# Patient Record
Sex: Female | Born: 1955
Health system: Southern US, Community
[De-identification: ages and names within clinical notes are randomized; demographics above are authoritative.]

## PROBLEM LIST (undated history)

## (undated) DIAGNOSIS — I10 Essential (primary) hypertension: Secondary | ICD-10-CM

## (undated) DIAGNOSIS — E119 Type 2 diabetes mellitus without complications: Secondary | ICD-10-CM

## (undated) DIAGNOSIS — E785 Hyperlipidemia, unspecified: Secondary | ICD-10-CM

## (undated) HISTORY — DX: Hyperlipidemia, unspecified: E78.5

## (undated) HISTORY — DX: Type 2 diabetes mellitus without complications: E11.9

## (undated) HISTORY — PX: ABDOMINAL HYSTERECTOMY: SHX81

## (undated) HISTORY — DX: Essential (primary) hypertension: I10

---

## 1997-11-07 ENCOUNTER — Other Ambulatory Visit: Admission: RE | Admit: 1997-11-07 | Discharge: 1997-11-07 | Payer: Self-pay | Admitting: Obstetrics & Gynecology

## 1999-04-23 ENCOUNTER — Other Ambulatory Visit: Admission: RE | Admit: 1999-04-23 | Discharge: 1999-04-23 | Payer: Self-pay | Admitting: Obstetrics & Gynecology

## 2000-07-22 ENCOUNTER — Other Ambulatory Visit: Admission: RE | Admit: 2000-07-22 | Discharge: 2000-07-22 | Payer: Self-pay | Admitting: Obstetrics & Gynecology

## 2002-01-18 ENCOUNTER — Other Ambulatory Visit: Admission: RE | Admit: 2002-01-18 | Discharge: 2002-01-18 | Payer: Self-pay | Admitting: Obstetrics & Gynecology

## 2002-01-25 ENCOUNTER — Ambulatory Visit (HOSPITAL_COMMUNITY): Admission: RE | Admit: 2002-01-25 | Discharge: 2002-01-25 | Payer: Self-pay | Admitting: Obstetrics & Gynecology

## 2002-01-25 ENCOUNTER — Encounter: Payer: Self-pay | Admitting: Obstetrics & Gynecology

## 2004-11-22 ENCOUNTER — Other Ambulatory Visit: Admission: RE | Admit: 2004-11-22 | Discharge: 2004-11-22 | Payer: Self-pay | Admitting: Obstetrics & Gynecology

## 2006-03-24 HISTORY — PX: COLON SURGERY: SHX602

## 2007-01-28 ENCOUNTER — Ambulatory Visit (HOSPITAL_COMMUNITY): Admission: RE | Admit: 2007-01-28 | Discharge: 2007-01-28 | Payer: Self-pay | Admitting: Obstetrics & Gynecology

## 2009-05-09 ENCOUNTER — Ambulatory Visit (HOSPITAL_COMMUNITY): Admission: RE | Admit: 2009-05-09 | Discharge: 2009-05-09 | Payer: Self-pay | Admitting: Obstetrics & Gynecology

## 2010-08-08 ENCOUNTER — Ambulatory Visit: Payer: Self-pay | Admitting: Physical Therapy

## 2010-08-14 ENCOUNTER — Other Ambulatory Visit (HOSPITAL_COMMUNITY): Payer: Self-pay | Admitting: Obstetrics & Gynecology

## 2010-08-14 ENCOUNTER — Ambulatory Visit (HOSPITAL_COMMUNITY)
Admission: RE | Admit: 2010-08-14 | Discharge: 2010-08-14 | Disposition: A | Discharge status: Home / Self Care | Payer: BC Managed Care – PPO | Source: Ambulatory Visit | Attending: Obstetrics & Gynecology | Admitting: Obstetrics & Gynecology

## 2010-08-14 DIAGNOSIS — N289 Disorder of kidney and ureter, unspecified: Secondary | ICD-10-CM | POA: Insufficient documentation

## 2010-08-14 DIAGNOSIS — S3710XA Unspecified injury of ureter, initial encounter: Secondary | ICD-10-CM

## 2010-08-14 MED ORDER — IOHEXOL 300 MG/ML  SOLN
100.0000 mL | Freq: Once | INTRAMUSCULAR | Status: AC | PRN
  Administered 2010-08-14: 100 mL via INTRAVENOUS

## 2013-03-24 HISTORY — PX: GALLBLADDER SURGERY: SHX652

## 2014-08-15 ENCOUNTER — Encounter: Payer: BLUE CROSS/BLUE SHIELD | Attending: Family Medicine

## 2014-08-15 VITALS — Ht 63.75 in | Wt 213.3 lb

## 2014-08-15 DIAGNOSIS — Z713 Dietary counseling and surveillance: Secondary | ICD-10-CM | POA: Insufficient documentation

## 2014-08-15 DIAGNOSIS — E119 Type 2 diabetes mellitus without complications: Secondary | ICD-10-CM | POA: Diagnosis present

## 2014-08-15 NOTE — Progress Notes (Signed)

## 2014-08-22 DIAGNOSIS — E119 Type 2 diabetes mellitus without complications: Secondary | ICD-10-CM | POA: Diagnosis not present

## 2014-08-22 NOTE — Progress Notes (Signed)

## 2014-08-29 ENCOUNTER — Encounter: Payer: BLUE CROSS/BLUE SHIELD | Attending: Family Medicine

## 2014-08-29 DIAGNOSIS — Z713 Dietary counseling and surveillance: Secondary | ICD-10-CM | POA: Insufficient documentation

## 2014-08-29 DIAGNOSIS — E119 Type 2 diabetes mellitus without complications: Secondary | ICD-10-CM | POA: Insufficient documentation

## 2014-08-29 NOTE — Progress Notes (Signed)
Patient was seen on 08/29/2014 for the third of a series of three diabetes self-management courses at the [facility].   Anna Barber. State the amount of activity recommended for healthy living . Describe activities suitable for individual needs . Identify ways to regularly incorporate activity into daily life . Identify barriers to activity and ways to over come these barriers  Identify diabetes medications being personally used and their primary action for lowering glucose and possible side effects . Describe role of stress on blood glucose and develop strategies to address psychosocial issues . Identify diabetes complications and ways to prevent them  Explain how to manage diabetes during illness . Evaluate success in meeting personal goal . Establish 2-3 goals that they will plan to diligently work on until they return for the  5762-month follow-up visit  Goals:   I will count my carb choices at most meals and snacks  I will be active 15 minutes or more 3 times a week  I will take my diabetes medications as scheduled  I will test my glucose at least 1 times a day, 7 days a week  Your patient has identified these potential barriers to change:  None stated  Your patient has identified their diabetes self-care support plan as  None stated Plan:  Attend Core 4 in 4 months

## 2015-10-25 ENCOUNTER — Encounter: Payer: Self-pay | Admitting: *Deleted

## 2015-10-25 ENCOUNTER — Encounter: Payer: BLUE CROSS/BLUE SHIELD | Attending: Family Medicine | Admitting: *Deleted

## 2015-10-25 DIAGNOSIS — Z713 Dietary counseling and surveillance: Secondary | ICD-10-CM | POA: Insufficient documentation

## 2015-10-25 DIAGNOSIS — E119 Type 2 diabetes mellitus without complications: Secondary | ICD-10-CM | POA: Insufficient documentation

## 2015-10-25 NOTE — Patient Instructions (Signed)
Eat breakfast each day.  It doesn't matter what  Eat meals a the table without the tv or book (ok to have audio book or music) Aim to eat more slowly Stop when satisfied/comfortable  Consider the health benefit of exercise, not focus on the weight loss because that doesn't help Live the life you want to lead

## 2015-10-25 NOTE — Progress Notes (Signed)
Medical Nutrition Therapy:  Appt start time: 0900 end time:  1000.   Assessment:  Primary concerns today: Anna Barber is here for nutrition counseling pertaining to follow up for diabetes education.  She attended DSME classes previously and requested a follow up with this provider.  She would like to lose weight.  She states she has been engaging in stress eating.  She wants some more specific guidelines.  She liked having a strict meal plan when she had GDM and wants a new meal plan She eats out for lunch.  She skips breakfast frequently.  Sometimes she cooks dinner and sometimes she gets fast food.  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  She states she is picky eater.  She gets the home chef 3 nights/week and it's 2 portions.  Sometimes she eats larger portions.  Thinks she makes decisions at the last minute.  Sometimes she throws out the sides from her home chef meals  Likes baby carrots, lettuce (iceberg or romaine), green beans, starchy vegetables, summer squash with onions.  Does not like cruciferous vegetables.  Likes orange, apples, grapes, bananas  States she eats large portions of most things.  She does not need variety and will eat the same thing over and over. Doesn't like condiments like salad dressing and mayo.   When at home, she eats in the bedroom or at the table in the sunroom. She typically watches tv while eating or reading or watching on her iPad or on her phone.  She is a Energy manager.  She doesn't drink much while eating, or drinks diet coke.  She chews ice (iron is WNL).  She does not drink alcohol much at all.  States she has a difficult 6 months after the election which "allowed me to give myself permission to eat the things I want to eat".  Does not have other sources of stress.  She has a good support system with family and friends.  Preferred Learning Style:   No preference indicated   Learning Readiness:   Change in  progress   MEDICATIONS: see list   DIETARY INTAKE:  24-hr recall:  B ( AM): peach  Snk ( AM): none  L ( PM): salad with iceberg lettuce, [place], cheddar, crumble of bacon.  Sometimes fritos Snk ( PM): 2 fudge popsicles. More often peanuts portioned out.  Also likes saltines with cheese or PB (likes to slather, not measure), also likes peaches in the summer D ( PM): 2 chicken fettuccini lean cusines and butter beans Snk ( PM): none Beverages: diet coke and melted ice  Usual physical activity: none.  Doesn't like to sweat    Nutritional Diagnosis:  NB-1.5 Disordered eating pattern As related to meal skipping and diet mentality, then emotional eating at night.  As evidenced by dietary recall.    Intervention:  Nutrition counseling provided.  Encouraged patient to reject traditional diet mentality of "good" vs "bad" foods.  There are no good and bad foods, but rather food is fuel that we needs for our bodies.  When we don't get enough fuel, our bodies suffer the metabolic consequences.  Encouraged patient to eat whatever foods will satisfy them, regardless of their nutritional value.  We will discuss nutritional values of foods at a subsequent appointment.  Encouraged patient to honor their body's internal hunger and fullness cues.  Throughout the day, check in mentally and rate hunger.  Try not to eat when ravenous, but instead when  slightly hungry.  Then choose food(s) that will be satisfying regardless of nutritional content.  Sit down to enjoy those foods.  Minimize distractions: turn off tv, put away books, work, Programmer, applications.  Make the meal last at least 20 minutes in order to give time to experience and register satiety.  Stop eating when full regardless of how much food is left on the plate.  Get more if still hungry.  The key is to honor fullness so throughout the meal, rate fullness factor and stop when comfortably full, but not stuffed.  Eventually the novelty will wear out and each food  will be equal in terms of its emotional appeal.  This will be a learning process and some days more food will be eaten, some days less.  The key is to honor hunger and fullness without any feelings of guilt.  Pay attention to what the internal cues are, rather than any external factors.  Goals: Eat breakfast each day.  It doesn't matter what  Eat meals a the table without the tv or book (ok to have audio book or music) Aim to eat more slowly Stop when satisfied/comfortable  Consider the health benefit of exercise, not focus on the weight loss because that doesn't help Live the life you want to lead  Teaching Method Utilized:  Auditory   Barriers to learning/adherence to lifestyle change: diet mentality  Demonstrated degree of understanding via:  Teach Back   Monitoring/Evaluation:  Dietary intake, exercise,  and body weight in 1 month(s).

## 2015-11-29 ENCOUNTER — Ambulatory Visit: Payer: BLUE CROSS/BLUE SHIELD | Admitting: *Deleted

## 2019-02-28 ENCOUNTER — Ambulatory Visit
Admission: RE | Admit: 2019-02-28 | Discharge: 2019-02-28 | Disposition: A | Discharge status: Home / Self Care | Payer: BLUE CROSS/BLUE SHIELD | Source: Ambulatory Visit | Attending: Family Medicine | Admitting: Family Medicine

## 2019-02-28 ENCOUNTER — Other Ambulatory Visit: Payer: Self-pay | Admitting: Family Medicine

## 2019-02-28 DIAGNOSIS — R0600 Dyspnea, unspecified: Secondary | ICD-10-CM

## 2019-03-04 ENCOUNTER — Ambulatory Visit (INDEPENDENT_AMBULATORY_CARE_PROVIDER_SITE_OTHER): Payer: BC Managed Care – PPO | Admitting: Cardiology

## 2019-03-04 ENCOUNTER — Other Ambulatory Visit: Payer: Self-pay

## 2019-03-04 ENCOUNTER — Encounter: Payer: Self-pay | Admitting: Cardiology

## 2019-03-04 VITALS — BP 114/60 | HR 107 | Ht 63.5 in | Wt 198.5 lb

## 2019-03-04 DIAGNOSIS — R0602 Shortness of breath: Secondary | ICD-10-CM

## 2019-03-04 DIAGNOSIS — R9431 Abnormal electrocardiogram [ECG] [EKG]: Secondary | ICD-10-CM | POA: Diagnosis not present

## 2019-03-04 DIAGNOSIS — E119 Type 2 diabetes mellitus without complications: Secondary | ICD-10-CM | POA: Diagnosis not present

## 2019-03-04 DIAGNOSIS — Z8249 Family history of ischemic heart disease and other diseases of the circulatory system: Secondary | ICD-10-CM | POA: Diagnosis not present

## 2019-03-04 DIAGNOSIS — E782 Mixed hyperlipidemia: Secondary | ICD-10-CM

## 2019-03-04 MED ORDER — NITROGLYCERIN 0.4 MG SL SUBL: 0.4000 mg | SUBLINGUAL_TABLET | SUBLINGUAL | 2 refills | Status: AC | PRN

## 2019-03-04 MED ORDER — ROSUVASTATIN CALCIUM 20 MG PO TABS: 20.0000 mg | ORAL_TABLET | Freq: Every day | ORAL | 2 refills | Status: DC

## 2019-03-04 MED ORDER — CLOPIDOGREL BISULFATE 75 MG PO TABS: 75.0000 mg | ORAL_TABLET | Freq: Every day | ORAL | 1 refills | Status: DC

## 2019-03-04 MED ORDER — PANTOPRAZOLE SODIUM 40 MG PO TBEC: 40.0000 mg | DELAYED_RELEASE_TABLET | Freq: Every day | ORAL | 1 refills | Status: DC

## 2019-03-04 MED ORDER — LOSARTAN POTASSIUM 25 MG PO TABS: 12.5000 mg | ORAL_TABLET | Freq: Every day | ORAL | 2 refills | Status: DC

## 2019-03-04 NOTE — Progress Notes (Signed)
Primary Physician:  Kelton Pillar, MD   Patient ID: Anna Barber, female    DOB: [birth year], 63 y.o.   MRN: [id number]  Subjective:    Chief Complaint  Patient presents with  . New Patient (Initial Visit)  . Shortness of Breath   HPI: Anna Barber  is a 63 y.o. female  with hyperlipidemia, type 2 diabetes, referred to [place] for worsening dyspnea and EKG changes by Dr. Laurann Montana.   She reports 1-2 days after halloween, she had an episode of what she describes as "GI attack". Had central chest pressure and discomfort between her shoulder blades. Chest pain resolved for the most part 10 days later. She states her symptoms were similar to when she had gallbladder attack. She had since developed shortness of breath at rest. She mostly noticed at night and would wake up having to take a good cleansing deep breath. Could breathe better sitting up. No leg swelling. She has not gained weight, actually has lost 20 lbs in the last 6 weeks due to decreased appetite and diarrhea. She was evaluated by her PCP and started on Nexium. For the last 3-4 days her symptoms have improved.    Denies any history of hypertension. States that lipids have been well controlled with Simvastatin. Diabetes is well controlled with Metformin.  She is a former smoker that quit approximately 32 years ago. Has family history of early CAD. Her father passed from MI at age 61.    Past Medical History:  Diagnosis Date  . Diabetes mellitus without complication ([place])   . Hyperlipidemia     Past Surgical History:  Procedure Laterality Date  . ABDOMINAL HYSTERECTOMY    . CESAREAN SECTION    . COLON SURGERY  2008   tubular adenoma  . GALLBLADDER SURGERY  2015    Social History   Socioeconomic History  . Marital status: Divorced    Spouse name: Not on file  . Number of children: 2  . Years of education: Not on file  . Highest education level: Not on file  Occupational History  . Not on file  Tobacco Use  .  Smoking status: Former Smoker    Packs/day: 1.50    Years: 15.00    Pack years: 22.50    Types: Cigarettes    Quit date: 03/04/1987    Years since quitting: 32.0  . Smokeless tobacco: Never Used  Substance and Sexual Activity  . Alcohol use: Yes    Comment: rarely  . Drug use: Not on file  . Sexual activity: Not on file  Other Topics Concern  . Not on file  Social History Narrative   2 grandchildren   Social Determinants of Health   Financial Resource Strain:   . Difficulty of Paying Living Expenses: Not on file  Food Insecurity:   . Worried About Charity fundraiser in the Last Year: Not on file  . Ran Out of Food in the Last Year: Not on file  Transportation Needs:   . Lack of Transportation (Medical): Not on file  . Lack of Transportation (Non-Medical): Not on file  Physical Activity:   . Days of Exercise per Week: Not on file  . Minutes of Exercise per Session: Not on file  Stress:   . Feeling of Stress : Not on file  Social Connections:   . Frequency of Communication with Friends and Family: Not on file  . Frequency of Social Gatherings with Friends and Family: Not on file  .  Attends Religious Services: Not on file  . Active Member of Clubs or Organizations: Not on file  . Attends Archivist Meetings: Not on file  . Marital Status: Not on file  Intimate Partner Violence:   . Fear of Current or Ex-Partner: Not on file  . Emotionally Abused: Not on file  . Physically Abused: Not on file  . Sexually Abused: Not on file    Review of Systems  Constitution: Positive for decreased appetite and weight loss. Negative for malaise/fatigue and weight gain.  Eyes: Negative for visual disturbance.  Cardiovascular: Positive for dyspnea on exertion. Negative for chest pain, claudication, leg swelling, orthopnea, palpitations and syncope.  Respiratory: Negative for hemoptysis and wheezing.   Endocrine: Negative for cold intolerance and heat intolerance.    Hematologic/Lymphatic: Does not bruise/bleed easily.  Skin: Negative for nail changes.  Musculoskeletal: Negative for muscle weakness and myalgias.  Gastrointestinal: Positive for diarrhea. Negative for abdominal pain, change in bowel habit, nausea and vomiting.  Neurological: Negative for difficulty with concentration, dizziness, focal weakness and headaches.  Psychiatric/Behavioral: Negative for altered mental status and suicidal ideas.  All other systems reviewed and are negative.     Objective:  Blood pressure 114/60, pulse (!) 107, height 5' 3.5" (1.613 m), weight 198 lb 8 oz (90 kg), SpO2 98 %. Body mass index is 34.61 kg/m.    Physical Exam  Constitutional: She is oriented to person, place, and time. Vital signs are normal. She appears well-developed and well-nourished.  HENT:  Head: Normocephalic and atraumatic.  Cardiovascular: Normal rate, regular rhythm, normal heart sounds and intact distal pulses.  Pulmonary/Chest: Effort normal and breath sounds normal. No accessory muscle usage. No respiratory distress.  Abdominal: Soft. Bowel sounds are normal.  Musculoskeletal:        General: Normal range of motion.     Cervical back: Normal range of motion.  Neurological: She is alert and oriented to person, place, and time.  Skin: Skin is warm and dry.  Vitals reviewed.  Radiology: No results found.  Laboratory examination:   02/28/2019: Glucose 130, Creatinine 0.67, eGFR 89/108, Potassium 4.2, CMP otherwise normal. WBC 11.8, RBC 4.02, Hgb 10.5, Hct 32.9, MCV normal, MCH 26.1, RDW 15.7%, Plt 540. TSH normal  No flowsheet data found. No flowsheet data found. Lipid Panel  No results found for: CHOL, TRIG, HDL, CHOLHDL, VLDL, LDLCALC, LDLDIRECT HEMOGLOBIN A1C No results found for: HGBA1C, MPG TSH No results for input(s): TSH in the last 8760 hours.  PRN Meds:. Medications Discontinued During This Encounter  Medication Reason  . simvastatin (ZOCOR) 40 MG tablet  Discontinued by provider  . esomeprazole (NEXIUM) 40 MG capsule Discontinued by provider   Current Meds  Medication Sig  . aspirin EC 81 MG tablet Take 81 mg by mouth daily.  . betamethasone dipropionate 0.05 % cream APPLY TWICE DAILY FOR 2 WEEKS AS NEEDED TO HAND DERMATITIS  . desonide (DESOWEN) 0.05 % cream APPLY TWICE DAILY FOR SEBORRHEIC DERMATITIS ON FACE FOR 1 TO 2 WEEKS  . metFORMIN (GLUCOPHAGE) 500 MG tablet Take by mouth as directed. 2 tablets in the morning, 2 tablets in the evening  . [DISCONTINUED] esomeprazole (NEXIUM) 40 MG capsule Take 40 mg by mouth 2 (two) times daily.  . [DISCONTINUED] simvastatin (ZOCOR) 40 MG tablet Take 40 mg by mouth daily.    Cardiac Studies:   none  Assessment:   Abnormal EKG - Plan: PCV ECHOCARDIOGRAM COMPLETE, Lipid Profile, Lipoprotein A (LPA)  Shortness of breath - Plan:  EKG 12-Lead, PCV MYOCARDIAL PERFUSION WITH LEXISCAN, B Nat Peptide  Family history of early CAD - Plan: Lipoprotein A (LPA)  Type 2 diabetes mellitus without complication, without long-term current use of insulin (HCC)  Mixed hyperlipidemia  PCP EKG 02/28/2019: Sinus tachycardia at 108 bpm, normal axis, PRWP, cannot exclude anterior infarct old. ST-T wave abnormality in anterolateral leads, cannot exclude ischemia.   EKG 03/04/2019: Sinus tachycardia at 105 bpm, left atrial enlargement, normal axis, anterolateral infarct old. Diffuse ST-T wave abnormality.   Recommendations:   Patient's recent episode that she considers "GI attack", concerning for angina and ACS. She is high risk given her family history of early CAD, diabetes, and HLD. By reviewing her EKG compared to her previous EKG in 2016, EKG is markedly abnormal with evidence of potential anterolateral infarct.  Over the last few days she has been feeling some better, and suspect she may have completed her infarct.  She will need further evaluation with Lexiscan nuclear stress testing for evaluation of any  reversible ischemia. Will send nitroglycerin should she have any recurrent symptoms. She is currently on 81 mg ASA. I will go ahead and start Plavix 75 mg daily. Due to potential interaction will change from Nexium to Protonix.   I also suspect her shortness of breath is related to depressed LVEF. Will obtain echocardiogram for further evaluation.  Also obtain BNP.  Clinically no evidence of heart failure.    She is tachycardic on exam that is likely compensatory; therefore, have not started beta-blocker therapy.  I will start her on low-dose losartan. Depending upon echo results, will anticipate changing to [facility] if indicated.  She will need BMP in 2 weeks for surveillance of her kidney function.  Blood pressure is borderline soft and will need to be closely monitored.   She has not had recent lipid panel, will obtain this for further risk stratification. Will also obtain lipoprotein A. She reports that her lipids have been fairly well controlled with Simvastatin. In view of recent events, will change to Crestor 20 mg daily. I will see her back in 3 weeks for close monitoring.  "Total time spent with patient was 60 minutes and greater than 50% of that time was spent in face to face discussion, counseling and coordination care"    *I have discussed this case with Dr. Virgina Jock and he personally examined the patient and participated in formulating the plan.*    Miquel Dunn, MSN, APRN, FNP-C [facility]. [place] Office: [phone] Fax: [phone]

## 2019-03-08 LAB — LIPID PANEL
Chol/HDL Ratio: 3 ratio (ref 0.0–4.4)
Cholesterol, Total: 90 mg/dL — ABNORMAL LOW (ref 100–199)
HDL: 30 mg/dL — ABNORMAL LOW (ref >39)
LDL Chol Calc (NIH): 37 mg/dL (ref 0–99)
Triglycerides: 131 mg/dL (ref 0–149)
VLDL Cholesterol Cal: 23 mg/dL (ref 5–40)

## 2019-03-08 LAB — LIPOPROTEIN A (LPA): Lipoprotein (a): 52 nmol/L (ref <75.0)

## 2019-03-09 ENCOUNTER — Other Ambulatory Visit: Payer: BC Managed Care – PPO

## 2019-03-09 ENCOUNTER — Telehealth: Payer: Self-pay

## 2019-03-09 ENCOUNTER — Other Ambulatory Visit: Payer: Self-pay

## 2019-03-09 ENCOUNTER — Ambulatory Visit (INDEPENDENT_AMBULATORY_CARE_PROVIDER_SITE_OTHER): Payer: BC Managed Care – PPO

## 2019-03-09 DIAGNOSIS — R0602 Shortness of breath: Secondary | ICD-10-CM

## 2019-03-09 DIAGNOSIS — R9431 Abnormal electrocardiogram [ECG] [EKG]: Secondary | ICD-10-CM | POA: Diagnosis not present

## 2019-03-09 NOTE — Telephone Encounter (Signed)
-----   Message from Ashton Haynes Kelley, NP sent at 03/09/2019  8:45 AM EST ----- Lipids and lipoprotein A are well controlled. Continue with medications as prescribed 

## 2019-03-09 NOTE — Telephone Encounter (Signed)
-----   Message from Miquel Dunn, NP sent at 03/09/2019  8:45 AM EST ----- Lipids and lipoprotein A are well controlled. Continue with medications as prescribed

## 2019-03-10 LAB — BRAIN NATRIURETIC PEPTIDE

## 2019-03-10 NOTE — Progress Notes (Signed)
FYI

## 2019-03-10 NOTE — Progress Notes (Signed)
Please add to my schedule for in office visit on 12/22 PM to discus results.

## 2019-03-15 ENCOUNTER — Telehealth: Payer: BC Managed Care – PPO | Admitting: Cardiology

## 2019-03-15 ENCOUNTER — Other Ambulatory Visit: Payer: Self-pay

## 2019-03-15 DIAGNOSIS — I255 Ischemic cardiomyopathy: Secondary | ICD-10-CM

## 2019-03-15 DIAGNOSIS — I5021 Acute systolic (congestive) heart failure: Secondary | ICD-10-CM

## 2019-03-15 NOTE — Progress Notes (Signed)
   Follow up visit  Subjective:   Anna Barber, female    DOB: Dec 24, 1955, 63 y.o.   MRN: [id number]    HPI  63 year old Caucasian female with a 2 diabetes mellitus, obesity, strong family history of early coronary artery disease, anteroseptal MI while at home in first week November.   She is currently out of town and did not know of her appt today. She is not having any chest pain, shortness of breath at this time.   Lexiscan nuclear stress test and echocardiogram show completed LAD territory infarct, now with systolic heart failure EF 25-30%. Discussed the following findings with the patient.   Echocardiogram 03/09/2019: Left ventricle cavity is normal in size and wall thickness. Large LAD territory akinesis with thinning s/o completed infarct. Severely depressed LV systolic function with EF 25%. Doppler evidence of grade I (impaired) diastolic dysfunction, normal LAP.  Patient is mildly tachycardic throughout the study.  Trileaflet aortic valve.  Trace aortic regurgitation. Mild (Grade I) mitral regurgitation. Mild tricuspid regurgitation. Estimated pulmonary artery systolic pressure is 27 mmHg. IVC is dilated with respiratory variation. Estimated RA pressure 8 mmHg.  Lexiscan Sestamibi Stress Test 03/09/2019: Nondiagnostic ECG stress. Resting EKG/ECG demonstrated normal sinus rhythm. Observed anterolateral infarct. Peak EKG/ECG no change with Lexiscan infusion. Patient developed chest pain level 2 of 10, and exhibited chest discomfort.  Extensive fixed perfusion abnormality with no reversibility noted in the mid to distal anterior, anteroseptal, apical and mid to distal inferoseptal and inferoapical region suggestive of a completed infarct in the LAD territory. Gated images reveal akinesis in the same region.  LVEF severely dysfunctional at 26%.  High risk study, no prior study for comparison.  She is out of town. I will see her in office on 12/28 and start ion Entresto. I will  place a cardiac MRI order to look for any viable myocardium in the infarcted territory.   Time spent: 8 min

## 2019-03-21 ENCOUNTER — Ambulatory Visit: Payer: BC Managed Care – PPO | Admitting: Cardiology

## 2019-03-21 ENCOUNTER — Encounter: Payer: Self-pay | Admitting: Cardiology

## 2019-03-21 ENCOUNTER — Other Ambulatory Visit: Payer: Self-pay

## 2019-03-21 VITALS — BP 122/60 | HR 103 | Temp 97.7°F | Ht 63.5 in | Wt 201.9 lb

## 2019-03-21 DIAGNOSIS — I255 Ischemic cardiomyopathy: Secondary | ICD-10-CM | POA: Diagnosis not present

## 2019-03-21 DIAGNOSIS — E119 Type 2 diabetes mellitus without complications: Secondary | ICD-10-CM | POA: Insufficient documentation

## 2019-03-21 DIAGNOSIS — I5020 Unspecified systolic (congestive) heart failure: Secondary | ICD-10-CM

## 2019-03-21 DIAGNOSIS — I5022 Chronic systolic (congestive) heart failure: Secondary | ICD-10-CM

## 2019-03-21 DIAGNOSIS — E782 Mixed hyperlipidemia: Secondary | ICD-10-CM

## 2019-03-21 DIAGNOSIS — I2510 Atherosclerotic heart disease of native coronary artery without angina pectoris: Secondary | ICD-10-CM | POA: Insufficient documentation

## 2019-03-21 MED ORDER — ENTRESTO 24-26 MG PO TABS: 1.0000 | ORAL_TABLET | Freq: Two times a day (BID) | ORAL | 2 refills | Status: DC

## 2019-03-21 MED ORDER — IVABRADINE HCL 5 MG PO TABS: 5.0000 mg | ORAL_TABLET | Freq: Two times a day (BID) | ORAL | 2 refills | Status: DC

## 2019-03-21 NOTE — Progress Notes (Addendum)
Follow up visit  Subjective:   Anna Barber, female    DOB: 1955-11-21, 63 y.o.   MRN: [id number]   HPI  63 year old Caucasian female with a 2 diabetes mellitus, obesity, strong family history of early coronary artery disease, anteroseptal MI while at home in first week November.   Lexiscan nuclear stress test and echocardiogram show completed LAD territory infarct, now with systolic heart failure EF 25-30%. I recommend MRI to evaluate for any hibernating myocardium. This is pending.  Patient has not had any recurrent chest pain. Her appetite, which was poor after the stated event in November, is now improving, Her breathing has improved, but she still has to stop after lifting heavy boxes out of car, for example.    Current Outpatient Medications on File Prior to Visit  Medication Sig Dispense Refill  . aspirin EC 81 MG tablet Take 81 mg by mouth daily.    . betamethasone dipropionate 0.05 % cream APPLY TWICE DAILY FOR 2 WEEKS AS NEEDED TO HAND DERMATITIS    . clopidogrel (PLAVIX) 75 MG tablet Take 1 tablet (75 mg total) by mouth daily. 30 tablet 1  . desonide (DESOWEN) 0.05 % cream APPLY TWICE DAILY FOR SEBORRHEIC DERMATITIS ON FACE FOR 1 TO 2 WEEKS    . losartan (COZAAR) 25 MG tablet Take 0.5 tablets (12.5 mg total) by mouth daily. 15 tablet 2  . metFORMIN (GLUCOPHAGE) 500 MG tablet Take by mouth as directed. 2 tablets in the morning, 2 tablets in the evening    . nitroGLYCERIN (NITROSTAT) 0.4 MG SL tablet Place 1 tablet (0.4 mg total) under the tongue every 5 (five) minutes as needed for chest pain. 30 tablet 2  . pantoprazole (PROTONIX) 40 MG tablet Take 1 tablet (40 mg total) by mouth daily. 30 tablet 1  . rosuvastatin (CRESTOR) 20 MG tablet Take 1 tablet (20 mg total) by mouth daily. 30 tablet 2   No current facility-administered medications on file prior to visit.    Cardiovascular & other pertient studies:  EKG 03/21/2019: Sinus tachycardia 105 bpm. Anterior  infarct, age indeterminate. Low voltage.  Echocardiogram 03/09/2019: Left ventricle cavity is normal in size and wall thickness. Large LAD territory akinesis with thinning s/o completed infarct. Severely depressed LV systolic function with EF 25%. Doppler evidence of grade I (impaired) diastolic dysfunction, normal LAP. Patient is mildly tachycardic throughout the study.  Trileaflet aortic valve. Trace aortic regurgitation. Mild (Grade I) mitral regurgitation. Mild tricuspid regurgitation. Estimated pulmonary artery systolic pressure is 27 mmHg. IVC is dilated with respiratory variation. Estimated RA pressure 8 mmHg.  Lexiscan Sestamibi Stress Test12/16/2020: Nondiagnostic ECG stress. Resting EKG/ECG demonstrated normal sinus rhythm. Observed anterolateral infarct. Peak EKG/ECG no change with Lexiscan infusion. Patient developed chest pain level 2 of 10, and exhibited chest discomfort.  Extensive fixed perfusion abnormality with no reversibility noted in the mid to distal anterior, anteroseptal, apical and mid to distal inferoseptal and inferoapical region suggestive of a completed infarct in the LAD territory. Gated images reveal akinesis in the same region. LVEF severely dysfunctional at 26%. High risk study, no prior study for comparison.  Recent labs: 03/07/2019: Chol 90, TG 131, HDL 30, LDL 37 Lipoprotein (a) 52, normal  02/28/2019:  Glucose 130, Cr 0.67. EGFR 89. K 4.2.  H/H 10.5/32.9. MCV normal. Platelets 540   Review of Systems  Constitution: Negative for decreased appetite, malaise/fatigue, weight gain and weight loss.  HENT: Negative for congestion.   Eyes: Negative for visual disturbance.  Cardiovascular: Positive  for dyspnea on exertion. Negative for chest pain, leg swelling, palpitations and syncope.  Respiratory: Positive for shortness of breath. Negative for cough.   Endocrine: Negative for cold intolerance.  Hematologic/Lymphatic: Does not bruise/bleed easily.   Skin: Negative for itching and rash.  Musculoskeletal: Negative for myalgias.  Gastrointestinal: Negative for abdominal pain, nausea and vomiting.  Genitourinary: Negative for dysuria.  Neurological: Negative for dizziness and weakness.  Psychiatric/Behavioral: The patient is not nervous/anxious.   All other systems reviewed and are negative.        Vitals:   03/21/19 1550  BP: 122/60  Pulse: (!) 103  Temp: 97.7 F (36.5 C)  SpO2: 97%     Body mass index is 35.2 kg/m. Filed Weights   03/21/19 1550  Weight: 201 lb 14.4 oz (91.6 kg)     Objective:   Physical Exam  Constitutional: She is oriented to person, place, and time. She appears well-developed and well-nourished. No distress.  HENT:  Head: Normocephalic and atraumatic.  Eyes: Pupils are equal, round, and reactive to light. Conjunctivae are normal.  Neck: No JVD present.  Cardiovascular: Regular rhythm and intact distal pulses. Tachycardia present.  No murmur heard. Pulmonary/Chest: Effort normal and breath sounds normal. She has no wheezes. She has no rales.  Abdominal: Soft. Bowel sounds are normal. There is no rebound.  Musculoskeletal:        General: Edema (Trace b/l) present.  Lymphadenopathy:    She has no cervical adenopathy.  Neurological: She is alert and oriented to person, place, and time. No cranial nerve deficit.  Skin: Skin is warm and dry.  Psychiatric: She has a normal mood and affect.  Nursing note and vitals reviewed.         Assessment & Recommendations:   63 year old Caucasian female with ischemic cardiomyopathy, new onset HFrEF, competed anterior MI in 01/2019, type 2 diabetes mellitus, strong family history of early CAD  HFrEF: Ischemic cardiomyopathy with completed anterosetpal infarct. No ischemia on stress test. Recommend cardiac MRI to evaluate for any hibernating myocardium. Start Entresto 25-26 mg bid, corlanor 5 mg bid. No beta blocker yet, given compensatory  tachycardia in the setting of HFrEF. Envision starting beta blocker and Farxiga in future. Check BMP, BNP next week.  Given Anterior MI and EF 25-30%, recommend wearable defibrillator, pending up titration of medical therapy.  Anticipate repeat echocardiogram in 05/2019.  CAD: No angina symptoms at this time. Complete LAD infarct in 01/2019. Continue medical therapy at this time. Given mild anemia, stopped aspirin. Continue plavix. She was supposed to have colonoscopy. Will hold until stabilization of heart failure.  Continue rosuvastatin 20 mg.  F/u in 2 weeks  [place] Surgeon Anna Hardy, MD [facility]. [place] Pager: [phone] Office: [phone]

## 2019-03-28 ENCOUNTER — Ambulatory Visit: Payer: BC Managed Care – PPO | Admitting: Cardiology

## 2019-03-28 ENCOUNTER — Telehealth: Payer: Self-pay

## 2019-03-28 NOTE — Telephone Encounter (Signed)
Based on history, EKG, echo and stress test, it is evident that patient suffered an MI at home in Nov 2020. She did not seek medical attention at that time. She has not undergone coronary angiography or stent placement.  Thanks MJP

## 2019-03-28 NOTE — Telephone Encounter (Signed)
Linda from [employer] life vest wants to know was there a stent that was done or a cath from patients MI that you mentioned she had early November ? They need this information in order to process the zoll life vest

## 2019-03-30 ENCOUNTER — Ambulatory Visit: Payer: BC Managed Care – PPO | Admitting: Cardiology

## 2019-03-30 ENCOUNTER — Telehealth (HOSPITAL_COMMUNITY): Payer: Self-pay | Admitting: Emergency Medicine

## 2019-03-30 NOTE — Telephone Encounter (Signed)
Reaching out to patient to offer assistance regarding upcoming cardiac imaging study; pt verbalizes understanding of appt date/time, parking situation and where to check in, and verified current allergies; name and call back number provided for further questions should they arise Anna Alexandria RN Navigator Cardiac Imaging [facility] [phone] office [phone] cell  Pt denies claustrophobia, denies implants >> is wearing an external defib vest (zoll vest)

## 2019-03-31 ENCOUNTER — Other Ambulatory Visit (HOSPITAL_COMMUNITY): Payer: Self-pay | Admitting: Cardiology

## 2019-03-31 ENCOUNTER — Ambulatory Visit (HOSPITAL_COMMUNITY)
Admission: RE | Admit: 2019-03-31 | Discharge: 2019-03-31 | Disposition: A | Discharge status: Home / Self Care | Payer: BC Managed Care – PPO | Source: Ambulatory Visit | Attending: Cardiology | Admitting: Cardiology

## 2019-03-31 ENCOUNTER — Other Ambulatory Visit: Payer: Self-pay

## 2019-03-31 DIAGNOSIS — I255 Ischemic cardiomyopathy: Secondary | ICD-10-CM | POA: Diagnosis not present

## 2019-03-31 LAB — CREATININE, SERUM
Creatinine, Ser: 0.83 mg/dL (ref 0.44–1.00)
GFR calc Af Amer: >60 mL/min (ref >60)
GFR calc non Af Amer: >60 mL/min (ref >60)

## 2019-03-31 MED ORDER — GADOBUTROL 1 MMOL/ML IV SOLN
12.0000 mL | Freq: Once | INTRAVENOUS | Status: AC | PRN
  Administered 2019-03-31: 12 mL via INTRAVENOUS

## 2019-04-01 ENCOUNTER — Other Ambulatory Visit: Payer: Self-pay | Admitting: Cardiology

## 2019-04-01 DIAGNOSIS — I5020 Unspecified systolic (congestive) heart failure: Secondary | ICD-10-CM

## 2019-04-01 LAB — BASIC METABOLIC PANEL
BUN/Creatinine Ratio: 9 — ABNORMAL LOW (ref 12–28)
BUN: 7 mg/dL — ABNORMAL LOW (ref 8–27)
CO2: 22 mmol/L (ref 20–29)
Calcium: 9.2 mg/dL (ref 8.7–10.3)
Chloride: 102 mmol/L (ref 96–106)
Creatinine, Ser: 0.74 mg/dL (ref 0.57–1.00)
GFR calc Af Amer: 100 mL/min/{1.73_m2} (ref >59)
GFR calc non Af Amer: 87 mL/min/{1.73_m2} (ref >59)
Glucose: 115 mg/dL — ABNORMAL HIGH (ref 65–99)
Potassium: 3.9 mmol/L (ref 3.5–5.2)
Sodium: 141 mmol/L (ref 134–144)

## 2019-04-01 LAB — BRAIN NATRIURETIC PEPTIDE: BNP: 494.1 pg/mL — ABNORMAL HIGH (ref 0.0–100.0)

## 2019-04-05 ENCOUNTER — Other Ambulatory Visit: Payer: Self-pay

## 2019-04-05 ENCOUNTER — Encounter: Payer: Self-pay | Admitting: Cardiology

## 2019-04-05 ENCOUNTER — Ambulatory Visit: Payer: BC Managed Care – PPO | Admitting: Cardiology

## 2019-04-05 VITALS — BP 134/64 | HR 89 | Temp 97.1°F | Resp 16 | Ht 63.5 in | Wt 210.8 lb

## 2019-04-05 DIAGNOSIS — I5020 Unspecified systolic (congestive) heart failure: Secondary | ICD-10-CM | POA: Diagnosis not present

## 2019-04-05 DIAGNOSIS — E119 Type 2 diabetes mellitus without complications: Secondary | ICD-10-CM | POA: Diagnosis not present

## 2019-04-05 DIAGNOSIS — I252 Old myocardial infarction: Secondary | ICD-10-CM

## 2019-04-05 DIAGNOSIS — I2510 Atherosclerotic heart disease of native coronary artery without angina pectoris: Secondary | ICD-10-CM | POA: Diagnosis not present

## 2019-04-05 DIAGNOSIS — I255 Ischemic cardiomyopathy: Secondary | ICD-10-CM

## 2019-04-05 MED ORDER — FARXIGA 10 MG PO TABS: 10.0000 mg | ORAL_TABLET | Freq: Every day | ORAL | 3 refills | Status: DC

## 2019-04-05 MED ORDER — SPIRONOLACTONE 25 MG PO TABS: 25.0000 mg | ORAL_TABLET | Freq: Every day | ORAL | 3 refills | Status: DC

## 2019-04-05 NOTE — Progress Notes (Signed)
Follow up visit  Subjective:   Anna Barber, female    DOB: 06-Sep-1955, 64 y.o.   MRN: [id number]   HPI  64 year old Caucasian female with ischemic cardiomyopathy, completed anterior MI in 01/2019, type 2 diabetes mellitus, strong family history of early CAD.   Lexiscan nuclear stress test and echocardiogram show completed LAD territory infarct, now with systolic heart failure EF 25-30%. MRI showed no viability in LAD territory.   She has not had any chest pain. She still has class II exertional dyspnea. She has noticed minimal leg swelling.   Patient suffered a prolonged episode of chest pain, shortness of breath, diaphoresis in 01/2019 for which she did not seek immediate medical attention. EKG, echocardiogram, stress test, and cardiac MRI point to a completed anterior infarct with no viability, no ischemia in rest of the myocardium, EF 35%.    Current Outpatient Medications on File Prior to Visit  Medication Sig Dispense Refill  . betamethasone dipropionate 0.05 % cream APPLY TWICE DAILY FOR 2 WEEKS AS NEEDED TO HAND DERMATITIS    . clopidogrel (PLAVIX) 75 MG tablet Take 1 tablet (75 mg total) by mouth daily. 30 tablet 1  . desonide (DESOWEN) 0.05 % cream APPLY TWICE DAILY FOR SEBORRHEIC DERMATITIS ON FACE FOR 1 TO 2 WEEKS    . ivabradine (CORLANOR) 5 MG TABS tablet Take 1 tablet (5 mg total) by mouth 2 (two) times daily with a meal. 60 tablet 2  . metFORMIN (GLUCOPHAGE) 500 MG tablet Take by mouth as directed. 2 tablets in the morning, 2 tablets in the evening    . pantoprazole (PROTONIX) 40 MG tablet Take 1 tablet (40 mg total) by mouth daily. 30 tablet 1  . rosuvastatin (CRESTOR) 20 MG tablet Take 1 tablet (20 mg total) by mouth daily. 30 tablet 2  . sacubitril-valsartan (ENTRESTO) 24-26 MG Take 1 tablet by mouth 2 (two) times daily. 60 tablet 2  . nitroGLYCERIN (NITROSTAT) 0.4 MG SL tablet Place 1 tablet (0.4 mg total) under the tongue every 5 (five) minutes as needed for  chest pain. (Patient not taking: Reported on 04/05/2019) 30 tablet 2   No current facility-administered medications on file prior to visit.    Cardiovascular & other pertient studies:  Cardiac MRI 03/31/2019: 1. Mildly dilated LV with EF 35%. Wall motion abnormalities as noted above. 2.  Normal RV size and systolic function. 3. LGE pattern suggests prior LAD-territory MI. The apex, the peri-apical segments, and the mid anterior wall and mid anteroseptal wall are unlikely to improve with revascularization (likely non-viable).   EKG 03/21/2019: Sinus tachycardia 105 bpm. Anterior infarct, age indeterminate. Low voltage.  Echocardiogram 03/09/2019: Left ventricle cavity is normal in size and wall thickness. Large LAD territory akinesis with thinning s/o completed infarct. Severely depressed LV systolic function with EF 25%. Doppler evidence of grade I (impaired) diastolic dysfunction, normal LAP. Patient is mildly tachycardic throughout the study.  Trileaflet aortic valve. Trace aortic regurgitation. Mild (Grade I) mitral regurgitation. Mild tricuspid regurgitation. Estimated pulmonary artery systolic pressure is 27 mmHg. IVC is dilated with respiratory variation. Estimated RA pressure 8 mmHg.  Lexiscan Sestamibi Stress Test12/16/2020: Nondiagnostic ECG stress. Resting EKG/ECG demonstrated normal sinus rhythm. Observed anterolateral infarct. Peak EKG/ECG no change with Lexiscan infusion. Patient developed chest pain level 2 of 10, and exhibited chest discomfort.  Extensive fixed perfusion abnormality with no reversibility noted in the mid to distal anterior, anteroseptal, apical and mid to distal inferoseptal and inferoapical region suggestive of a completed infarct  in the LAD territory. Gated images reveal akinesis in the same region. LVEF severely dysfunctional at 26%. High risk study, no prior study for comparison.  Recent labs: 03/31/2019: BNP 494 Glucose 115. BUN/Cr 7/0.74.  eGFR 87. Na/K 141/3.9  03/07/2019: Chol 90, TG 131, HDL 30, LDL 37 Lipoprotein (a) 52, normal  02/28/2019:  Glucose 130, Cr 0.67. EGFR 89. K 4.2.  H/H 10.5/32.9. MCV normal. Platelets 540   Review of Systems  Cardiovascular: Positive for dyspnea on exertion. Negative for chest pain, leg swelling, palpitations and syncope.  Respiratory: Positive for shortness of breath.          Vitals:   04/05/19 1339  BP: 134/64  Pulse: 89  Resp: 16  Temp: (!) 97.1 F (36.2 C)  SpO2: 96%     Body mass index is 36.76 kg/m. Filed Weights   04/05/19 1339  Weight: 210 lb 12.8 oz (95.6 kg)     Objective:   Physical Exam  Constitutional: She appears well-developed and well-nourished.  Neck: No JVD present.  Cardiovascular: Normal rate, regular rhythm, normal heart sounds and intact distal pulses.  No murmur heard. Pulmonary/Chest: Effort normal and breath sounds normal. She has no wheezes. She has no rales.  Musculoskeletal:        General: Edema (Trace b/l) present.  Nursing note and vitals reviewed.         Assessment & Recommendations:   64 year old Caucasian female with ischemic cardiomyopathy, new onset HFrEF, competed anterior MI in 01/2019, type 2 diabetes mellitus, strong family history of early CAD  HFrEF: Ischemic cardiomyopathy with completed anterior infarct-suffered while at home, did not see immediate medical attention. (01/2019) No viability in LAD territory on cardiac MRI. BNP 494 on 03/31/2019. Continue Entresto 24-26 mg bid, corlanor 5 mg bid. HR much better controlled.  Started spironolactone 25 mg and Farxiga 10 mg daily. Encouraged liberal hydration. BMP and BNP check in 1 week.  F/u in 2 weeks. Subject to BP, will start metoprolol succinate 25 mg daily. Currently wearing wearable defibrillator. While she is on GDMT for heart failure, she has completed LAD infarct with EF 35% on cardiac MRI, performed 2 months post MI. Will refer to EP for evaluation  for ICD.  CAD: No angina symptoms at this time. Patient suffered a prolonged episode of chest pain, shortness of breath, diaphoresis in 01/2019 for which she did not seek immediate medical attention. EKG, echocardiogram, stress test, and cardiac MRI point to a completed anterior infarct with no viability, no ischemia in rest of the myocardium, EF 35%.  Continue medical therapy at this time. Continue rosuvastatin 20 mg. Given mild anemia, stopped aspirin. Continue plavix. She was supposed to have colonoscopy. Will hold until stabilization of heart failure.   F/u w/Ashton Georgina Peer, FNP in 2 weeks.   Nigel Mormon, MD [facility]. [place] Pager: [phone] Office: [phone]

## 2019-04-07 ENCOUNTER — Ambulatory Visit: Payer: BC Managed Care – PPO | Admitting: Cardiology

## 2019-04-16 LAB — BASIC METABOLIC PANEL
BUN/Creatinine Ratio: 13 (ref 12–28)
BUN: 11 mg/dL (ref 8–27)
CO2: 23 mmol/L (ref 20–29)
Calcium: 9.2 mg/dL (ref 8.7–10.3)
Chloride: 103 mmol/L (ref 96–106)
Creatinine, Ser: 0.88 mg/dL (ref 0.57–1.00)
GFR calc Af Amer: 81 mL/min/{1.73_m2} (ref >59)
GFR calc non Af Amer: 70 mL/min/{1.73_m2} (ref >59)
Glucose: 132 mg/dL — ABNORMAL HIGH (ref 65–99)
Potassium: 3.7 mmol/L (ref 3.5–5.2)
Sodium: 142 mmol/L (ref 134–144)

## 2019-04-16 LAB — BRAIN NATRIURETIC PEPTIDE: BNP: 375 pg/mL — ABNORMAL HIGH (ref 0.0–100.0)

## 2019-04-19 ENCOUNTER — Ambulatory Visit: Payer: BC Managed Care – PPO | Admitting: Internal Medicine

## 2019-04-19 ENCOUNTER — Other Ambulatory Visit: Payer: Self-pay

## 2019-04-19 ENCOUNTER — Encounter: Payer: Self-pay | Admitting: Internal Medicine

## 2019-04-19 VITALS — BP 126/66 | HR 75 | Ht 63.5 in | Wt 195.0 lb

## 2019-04-19 DIAGNOSIS — I255 Ischemic cardiomyopathy: Secondary | ICD-10-CM

## 2019-04-19 NOTE — Patient Instructions (Addendum)
Medication Instructions:  Your physician has recommended you make the following change in your medication:   1.  Start taking carvedilol 3.125 mg--Take one tablet by mouth twice a day  Labwork: None ordered.  Testing/Procedures: None ordered.  Follow-Up: Your physician wants you to follow-up in: based on results of your ECHO.  Any Other Special Instructions Will Be Listed Below (If Applicable).  If you need a refill on your cardiac medications before your next appointment, please call your pharmacy.

## 2019-04-19 NOTE — Progress Notes (Signed)
HPI Anna Barber is referred today by Dr. Virgina Jock for consideration for ICD insertion. She is a pleasant obese middle aged woman with sustained an out of hospital MI 2 months ago. She has done well though I do not think that she is doing much in the way of exercise. She has class 2 CHF symptoms. She has not been on a beta blocker. Her EF by MR is 30-35%. She has not had syncope and is wearing a life vest. She feels well but notes that she cannot perform any regular exercise.  Allergies  Allergen Reactions  . Sulfa Antibiotics Rash     Current Outpatient Medications  Medication Sig Dispense Refill  . betamethasone dipropionate 0.05 % cream APPLY TWICE DAILY FOR 2 WEEKS AS NEEDED TO HAND DERMATITIS    . clopidogrel (PLAVIX) 75 MG tablet Take 1 tablet (75 mg total) by mouth daily. 30 tablet 1  . dapagliflozin propanediol (FARXIGA) 10 MG TABS tablet Take 10 mg by mouth daily. 30 tablet 3  . desonide (DESOWEN) 0.05 % cream APPLY TWICE DAILY FOR SEBORRHEIC DERMATITIS ON FACE FOR 1 TO 2 WEEKS    . ivabradine (CORLANOR) 5 MG TABS tablet Take 1 tablet (5 mg total) by mouth 2 (two) times daily with a meal. 60 tablet 2  . metFORMIN (GLUCOPHAGE) 500 MG tablet Take by mouth as directed. 2 tablets in the morning, 2 tablets in the evening    . nitroGLYCERIN (NITROSTAT) 0.4 MG SL tablet Place 1 tablet (0.4 mg total) under the tongue every 5 (five) minutes as needed for chest pain. 30 tablet 2  . pantoprazole (PROTONIX) 40 MG tablet Take 1 tablet (40 mg total) by mouth daily. 30 tablet 1  . rosuvastatin (CRESTOR) 20 MG tablet Take 1 tablet (20 mg total) by mouth daily. 30 tablet 2  . sacubitril-valsartan (ENTRESTO) 24-26 MG Take 1 tablet by mouth 2 (two) times daily. 60 tablet 2  . spironolactone (ALDACTONE) 25 MG tablet Take 1 tablet (25 mg total) by mouth daily. 30 tablet 3   No current facility-administered medications for this visit.     Past Medical History:  Diagnosis Date  . Diabetes  mellitus without complication ([place])   . Hyperlipidemia     ROS:   All systems reviewed and negative except as noted in the HPI.   Past Surgical History:  Procedure Laterality Date  . ABDOMINAL HYSTERECTOMY    . CESAREAN SECTION    . COLON SURGERY  2008   tubular adenoma  . GALLBLADDER SURGERY  2015     Family History  Problem Relation Age of Onset  . Diabetes Maternal Aunt   . Diabetes Other   . Lung cancer Mother   . Heart attack Father      Social History   Socioeconomic History  . Marital status: Divorced    Spouse name: Not on file  . Number of children: 2  . Years of education: Not on file  . Highest education level: Not on file  Occupational History  . Not on file  Tobacco Use  . Smoking status: Former Smoker    Packs/day: 1.50    Years: 15.00    Pack years: 22.50    Types: Cigarettes    Quit date: 03/04/1987    Years since quitting: 32.1  . Smokeless tobacco: Never Used  Substance and Sexual Activity  . Alcohol use: Yes    Comment: rarely  . Drug use: Never  . Sexual activity:  Not on file  Other Topics Concern  . Not on file  Social History Narrative   2 grandchildren   Social Determinants of Health   Financial Resource Strain:   . Difficulty of Paying Living Expenses: Not on file  Food Insecurity:   . Worried About Programme researcher, broadcasting/film/video in the Last Year: Not on file  . Ran Out of Food in the Last Year: Not on file  Transportation Needs:   . Lack of Transportation (Medical): Not on file  . Lack of Transportation (Non-Medical): Not on file  Physical Activity:   . Days of Exercise per Week: Not on file  . Minutes of Exercise per Session: Not on file  Stress:   . Feeling of Stress : Not on file  Social Connections:   . Frequency of Communication with Friends and Family: Not on file  . Frequency of Social Gatherings with Friends and Family: Not on file  . Attends Religious Services: Not on file  . Active Member of Clubs or Organizations:  Not on file  . Attends Banker Meetings: Not on file  . Marital Status: Not on file  Intimate Partner Violence:   . Fear of Current or Ex-Partner: Not on file  . Emotionally Abused: Not on file  . Physically Abused: Not on file  . Sexually Abused: Not on file     BP 126/66   Pulse 75   Ht 5' 3.5" (1.613 m)   Wt 195 lb (88.5 kg)   SpO2 99%   BMI 34.00 kg/m   Physical Exam:  Well appearing obese 64 yo woman, NAD HEENT: Unremarkable Neck:  No JVD, no thyromegally Lymphatics:  No adenopathy Back:  No CVA tenderness Lungs:  Clear with no wheezes HEART:  Regular rate rhythm, no murmurs, no rubs, no clicks Abd:  soft, positive bowel sounds, no organomegally, no rebound, no guarding Ext:  2 plus pulses, no edema, no cyanosis, no clubbing Skin:  No rashes no nodules Neuro:  CN II through XII intact, motor grossly intact  EKG - nsr with extensive Anterior MI pattern with residual STT abnormality  MRI - reviewed 2d echo - reviewed   Assess/Plan: 1. ICM - she is s/p late presenting MI and did not receive revascularization. She is at increase risk both ventricular arrhythmias and CHF. I have recommended she be started and then uptitrated on a low dose beta blocker followed by ICD insertion if her EF does not improve and she is on guideline directed medical therapy. I have recommended a repeat 2D echo in about 2 months. 2. Chronic systolic heart failure - she has class 2 symptoms. She will continue her current meds. 3. Obesity - she will need to work on this as her treatment continues.   Anna Barber.D

## 2019-04-20 ENCOUNTER — Ambulatory Visit (INDEPENDENT_AMBULATORY_CARE_PROVIDER_SITE_OTHER): Payer: BC Managed Care – PPO | Admitting: Cardiology

## 2019-04-20 ENCOUNTER — Encounter: Payer: Self-pay | Admitting: Cardiology

## 2019-04-20 VITALS — BP 125/61 | HR 69 | Temp 97.1°F | Ht 63.5 in | Wt 195.5 lb

## 2019-04-20 DIAGNOSIS — I255 Ischemic cardiomyopathy: Secondary | ICD-10-CM

## 2019-04-20 DIAGNOSIS — I5020 Unspecified systolic (congestive) heart failure: Secondary | ICD-10-CM | POA: Diagnosis not present

## 2019-04-20 DIAGNOSIS — I2510 Atherosclerotic heart disease of native coronary artery without angina pectoris: Secondary | ICD-10-CM

## 2019-04-20 MED ORDER — CARVEDILOL 3.125 MG PO TABS: 3.1250 mg | ORAL_TABLET | Freq: Two times a day (BID) | ORAL | 1 refills | Status: DC

## 2019-04-20 MED ORDER — ENTRESTO 49-51 MG PO TABS: 1.0000 | ORAL_TABLET | Freq: Two times a day (BID) | ORAL | 1 refills | Status: DC

## 2019-04-20 NOTE — Progress Notes (Signed)
Follow up visit  Subjective:   Anna Barber, female    DOB: 1955-08-18, 64 y.o.   MRN: [id number]   HPI  64 year old Caucasian female with ischemic cardiomyopathy, completed anterior MI in 01/2019, type 2 diabetes mellitus, strong family history of early CAD.   Lexiscan nuclear stress test and echocardiogram show completed LAD territory infarct, now with systolic heart failure EF 25-30%. MRI showed no viability in LAD territory.  Recently evaluated by Dr. Lovena Le for potential ICD implantation for ischemic cardiomyopathy.  Felt to be high risk for ventricular arrhythmias given her cardiomyopathy.  Recommended starting Coreg, but patient wanted to discuss with [place].  Recommend repeating echocardiogram and depending upon her cardiomyopathy doing ICD implantation at that time.  She has not had any chest pain. She still has class II exertional dyspnea, but this seems to be improving. No leg swelling.    Patient suffered a prolonged episode of chest pain, shortness of breath, diaphoresis in 01/2019 for which she did not seek immediate medical attention. EKG, echocardiogram, stress test, and cardiac MRI point to a completed anterior infarct with no viability, no ischemia in rest of the myocardium, EF 35%.    Current Outpatient Medications on File Prior to Visit  Medication Sig Dispense Refill  . betamethasone dipropionate 0.05 % cream APPLY TWICE DAILY FOR 2 WEEKS AS NEEDED TO HAND DERMATITIS    . clopidogrel (PLAVIX) 75 MG tablet Take 1 tablet (75 mg total) by mouth daily. 30 tablet 1  . dapagliflozin propanediol (FARXIGA) 10 MG TABS tablet Take 10 mg by mouth daily. 30 tablet 3  . desonide (DESOWEN) 0.05 % cream APPLY TWICE DAILY FOR SEBORRHEIC DERMATITIS ON FACE FOR 1 TO 2 WEEKS    . ivabradine (CORLANOR) 5 MG TABS tablet Take 1 tablet (5 mg total) by mouth 2 (two) times daily with a meal. 60 tablet 2  . metFORMIN (GLUCOPHAGE) 500 MG tablet Take by mouth as directed. 2 tablets in the  morning, 2 tablets in the evening    . nitroGLYCERIN (NITROSTAT) 0.4 MG SL tablet Place 1 tablet (0.4 mg total) under the tongue every 5 (five) minutes as needed for chest pain. 30 tablet 2  . pantoprazole (PROTONIX) 40 MG tablet Take 1 tablet (40 mg total) by mouth daily. 30 tablet 1  . rosuvastatin (CRESTOR) 20 MG tablet Take 1 tablet (20 mg total) by mouth daily. 30 tablet 2  . spironolactone (ALDACTONE) 25 MG tablet Take 1 tablet (25 mg total) by mouth daily. 30 tablet 3   No current facility-administered medications on file prior to visit.    Cardiovascular & other pertient studies:  Cardiac MRI 03/31/2019: 1. Mildly dilated LV with EF 35%. Wall motion abnormalities as noted above. 2.  Normal RV size and systolic function. 3. LGE pattern suggests prior LAD-territory MI. The apex, the peri-apical segments, and the mid anterior wall and mid anteroseptal wall are unlikely to improve with revascularization (likely non-viable).   EKG 03/21/2019: Sinus tachycardia 105 bpm. Anterior infarct, age indeterminate. Low voltage.  Echocardiogram 03/09/2019: Left ventricle cavity is normal in size and wall thickness. Large LAD territory akinesis with thinning s/o completed infarct. Severely depressed LV systolic function with EF 25%. Doppler evidence of grade I (impaired) diastolic dysfunction, normal LAP. Patient is mildly tachycardic throughout the study.  Trileaflet aortic valve. Trace aortic regurgitation. Mild (Grade I) mitral regurgitation. Mild tricuspid regurgitation. Estimated pulmonary artery systolic pressure is 27 mmHg. IVC is dilated with respiratory variation. Estimated RA pressure 8  mmHg.  Lexiscan Sestamibi Stress Test12/16/2020: Nondiagnostic ECG stress. Resting EKG/ECG demonstrated normal sinus rhythm. Observed anterolateral infarct. Peak EKG/ECG no change with Lexiscan infusion. Patient developed chest pain level 2 of 10, and exhibited chest discomfort.  Extensive fixed  perfusion abnormality with no reversibility noted in the mid to distal anterior, anteroseptal, apical and mid to distal inferoseptal and inferoapical region suggestive of a completed infarct in the LAD territory. Gated images reveal akinesis in the same region. LVEF severely dysfunctional at 26%. High risk study, no prior study for comparison.  Recent labs: 03/31/2019: BNP 494 Glucose 115. BUN/Cr 7/0.74. eGFR 87. Na/K 141/3.9  03/07/2019: Chol 90, TG 131, HDL 30, LDL 37 Lipoprotein (a) 52, normal  02/28/2019:  Glucose 130, Cr 0.67. EGFR 89. K 4.2.  H/H 10.5/32.9. MCV normal. Platelets 540   Review of Systems  Cardiovascular: Positive for dyspnea on exertion. Negative for chest pain, leg swelling, palpitations and syncope.  Respiratory: Positive for shortness of breath.          Vitals:   04/20/19 1136  BP: 125/61  Pulse: 69  Temp: (!) 97.1 F (36.2 C)  SpO2: 96%     Body mass index is 34.09 kg/m. Filed Weights   04/20/19 1136  Weight: 195 lb 8 oz (88.7 kg)     Objective:   Physical Exam  Constitutional: She appears well-developed and well-nourished.  Neck: No JVD present.  Cardiovascular: Normal rate, regular rhythm, normal heart sounds and intact distal pulses.  No murmur heard. Pulmonary/Chest: Effort normal and breath sounds normal. She has no wheezes. She has no rales.  Musculoskeletal:        General: Edema (Trace b/l) present.  Nursing note and vitals reviewed.         Assessment & Recommendations:   64 year old Caucasian female with ischemic cardiomyopathy, new onset HFrEF, competed anterior MI in 01/2019, type 2 diabetes mellitus, strong family history of early CAD  HFrEF: Ischemic cardiomyopathy with completed anterior infarct-suffered while at home, did not see immediate medical attention. (01/2019) No viability in LAD territory on cardiac MRI. BNP 494 on 03/31/2019, recently improved to 375 on 04/15/19. Increase Entresto to 49-51 mg BID.  Add Coreg 3.125 mg BID. Continue Corlanor 5 mg daily and Aldactone.  Encouraged liberal hydration. Will again check BMP and BNP in 10 days.  F/u in 2 weeks.  Currently wearing wearable defibrillator. While she is on GDMT for heart failure, she has completed LAD infarct with EF 35% on cardiac MRI, performed 2 months post MI. Will need echo in 2 months and depending upon this, may need ICD.  CAD: No angina symptoms at this time. Patient suffered a prolonged episode of chest pain, shortness of breath, diaphoresis in 01/2019 for which she did not seek immediate medical attention. EKG, echocardiogram, stress test, and cardiac MRI point to a completed anterior infarct with no viability, no ischemia in rest of the myocardium, EF 35%.  Continue medical therapy at this time. Continue rosuvastatin 20 mg. Given mild anemia, stopped aspirin. Continue plavix. She was supposed to have colonoscopy. Will hold until stabilization of heart failure.    Follow up in 2 weeks for reevaluation of Entresto  Miquel Dunn, MSN, APRN, FNP-C [facility]. [place] Office: [phone] Fax: [phone]

## 2019-04-30 LAB — BASIC METABOLIC PANEL
BUN/Creatinine Ratio: 9 — ABNORMAL LOW (ref 12–28)
BUN: 7 mg/dL — ABNORMAL LOW (ref 8–27)
CO2: 20 mmol/L (ref 20–29)
Calcium: 9.3 mg/dL (ref 8.7–10.3)
Chloride: 103 mmol/L (ref 96–106)
Creatinine, Ser: 0.82 mg/dL (ref 0.57–1.00)
GFR calc Af Amer: 88 mL/min/{1.73_m2} (ref >59)
GFR calc non Af Amer: 76 mL/min/{1.73_m2} (ref >59)
Glucose: 136 mg/dL — ABNORMAL HIGH (ref 65–99)
Potassium: 3.7 mmol/L (ref 3.5–5.2)
Sodium: 142 mmol/L (ref 134–144)

## 2019-04-30 LAB — BRAIN NATRIURETIC PEPTIDE: BNP: 880.4 pg/mL — ABNORMAL HIGH (ref 0.0–100.0)

## 2019-05-02 NOTE — Progress Notes (Signed)
Lets up titrate Entresto and/or spironolactone. May need lasix too. Needs close f/u.  Thanks MJP

## 2019-05-03 ENCOUNTER — Other Ambulatory Visit: Payer: Self-pay

## 2019-05-03 ENCOUNTER — Ambulatory Visit: Payer: BC Managed Care – PPO | Admitting: Cardiology

## 2019-05-03 ENCOUNTER — Other Ambulatory Visit: Payer: Self-pay | Admitting: Cardiology

## 2019-05-03 ENCOUNTER — Encounter: Payer: Self-pay | Admitting: Cardiology

## 2019-05-03 VITALS — BP 129/63 | HR 67 | Temp 96.3°F | Ht 63.0 in | Wt 198.0 lb

## 2019-05-03 DIAGNOSIS — I2510 Atherosclerotic heart disease of native coronary artery without angina pectoris: Secondary | ICD-10-CM

## 2019-05-03 DIAGNOSIS — I255 Ischemic cardiomyopathy: Secondary | ICD-10-CM | POA: Diagnosis not present

## 2019-05-03 DIAGNOSIS — I5020 Unspecified systolic (congestive) heart failure: Secondary | ICD-10-CM | POA: Diagnosis not present

## 2019-05-03 MED ORDER — ENTRESTO 97-103 MG PO TABS: 1.0000 | ORAL_TABLET | Freq: Two times a day (BID) | ORAL | 1 refills | Status: DC

## 2019-05-03 MED ORDER — FUROSEMIDE 20 MG PO TABS: 20.0000 mg | ORAL_TABLET | Freq: Every day | ORAL | 1 refills | Status: DC

## 2019-05-03 NOTE — Patient Instructions (Signed)
Heart Failure Eating Plan Heart failure, also called congestive heart failure, occurs when your heart does not pump blood well enough to meet your body's needs for oxygen-rich blood. Heart failure is a long-term (chronic) condition. Living with heart failure can be challenging. However, following your health care provider's instructions about a healthy lifestyle and working with a diet and nutrition specialist (dietitian) to choose the right foods may help to improve your symptoms. What are tips for following this plan? Reading food labels  Check food labels for the amount of sodium per serving. Choose foods that have less than 140 mg (milligrams) of sodium in each serving.  Check food labels for the number of calories per serving. This is important if you need to limit your daily calorie intake to lose weight.  Check food labels for the serving size. If you eat more than one serving, you will be eating more sodium and calories than what is listed on the label.  Look for foods that are labeled as "sodium-free," "very low sodium," or "low sodium." ? Foods labeled as "reduced sodium" or "lightly salted" may still have more sodium than what is recommended for you. Cooking  Avoid adding salt when cooking. Ask your health care provider or dietitian before using salt substitutes.  Season food with salt-free seasonings, spices, or herbs. Check the label of seasoning mixes to make sure they do not contain salt.  Cook with heart-healthy oils, such as olive, canola, soybean, or sunflower oil.  Do not fry foods. Cook foods using low-fat methods, such as baking, boiling, grilling, and broiling.  Limit unhealthy fats when cooking by: ? Removing the skin from poultry, such as chicken. ? Removing all visible fats from meats. ? Skimming the fat off from stews, soups, and gravies before serving them. Meal planning   Limit your intake of: ? Processed, canned, or pre-packaged foods. ? Foods that are  high in trans fat, such as fried foods. ? Sweets, desserts, sugary drinks, and other foods with added sugar. ? Full-fat dairy products, such as whole milk.  Eat a balanced diet that includes: ? 4-5 servings of fruit each day and 4-5 servings of vegetables each day. At each meal, try to fill half of your plate with fruits and vegetables. ? Up to 6-8 servings of whole grains each day. ? Up to 2 servings of lean meat, poultry, or fish each day. One serving of meat is equal to 3 oz. This is about the same size as a deck of cards. ? 2 servings of low-fat dairy each day. ? Heart-healthy fats. Healthy fats called omega-3 fatty acids are found in foods such as flaxseed and cold-water fish like sardines, salmon, and mackerel.  Aim to eat 25-35 g (grams) of fiber a day. Foods that are high in fiber include apples, broccoli, carrots, beans, peas, and whole grains.  Do not add salt or condiments that contain salt (such as soy sauce) to foods before eating.  When eating at a restaurant, ask that your food be prepared with less salt or no salt, if possible.  Try to eat 2 or more vegetarian meals each week.  Eat more home-cooked food and eat less restaurant, buffet, and fast food. General information  Do not eat more than 2,300 mg of salt (sodium) a day. The amount of sodium that is recommended for you may be lower, depending on your condition.  Maintain a healthy body weight as directed. Ask your health care provider what a healthy weight is   for you. ? Check your weight every day. ? Work with your health care provider and dietitian to make a plan that is right for you to lose weight or maintain your current weight.  Limit how much fluid you drink. Ask your health care provider or dietitian how much fluid you can have each day.  Limit or avoid alcohol as told by your health care provider or dietitian. Recommended foods The items listed may not be a complete list. Talk with your dietitian about what  dietary choices are best for you. Fruits All fresh, frozen, and canned fruits. Dried fruits, such as raisins, prunes, and cranberries. Vegetables All fresh vegetables. Vegetables that are frozen without sauce or added salt. Low-sodium or sodium-free canned vegetables. Grains Bread with less than 80 mg of sodium per slice. Whole-wheat pasta, quinoa, and brown rice. Oats and oatmeal. Barley. Millet. Grits and cream of wheat. Whole-grain and whole-wheat cold cereal. Meats and other protein foods Lean cuts of meat. Skinless chicken and turkey. Fish with high omega-3 fatty acids, such as salmon, sardines, and other cold-water fishes. Eggs. Dried beans, peas, and edamame. Unsalted nuts and nut butters. Dairy Low-fat or nonfat (skim) milk and dried milk. Rice milk, soy milk, and almond milk. Low-fat or nonfat yogurt. Small amounts of reduced-sodium block cheese. Low-sodium cottage cheese. Fats and oils Olive, canola, soybean, flaxseed, or sunflower oil. Avocado. Sweets and desserts Apple sauce. Granola bars. Sugar-free pudding and gelatin. Frozen fruit bars. Seasoning and other foods Fresh and dried herbs. Lemon or lime juice. Vinegar. Low-sodium ketchup. Salt-free marinades, salad dressings, sauces, and seasonings. The items listed above may not be a complete list of foods and beverages you can eat. Contact a dietitian for more information. Foods to avoid The items listed may not be a complete list. Talk with your dietitian about what dietary choices are best for you. Fruits Fruits that are dried with sodium-containing preservatives. Vegetables Canned vegetables. Frozen vegetables with sauce or seasonings. Creamed vegetables. French fries. Onion rings. Pickled vegetables and sauerkraut. Grains Bread with more than 80 mg of sodium per slice. Hot or cold cereal with more than 140 mg sodium per serving. Salted pretzels and crackers. Pre-packaged breadcrumbs. Bagels, croissants, and biscuits. Meats  and other protein foods Ribs and chicken wings. Bacon, ham, pepperoni, bologna, salami, and packaged luncheon meats. Hot dogs, bratwurst, and sausage. Canned meat. Smoked meat and fish. Salted nuts and seeds. Dairy Whole milk, half-and-half, and cream. Buttermilk. Processed cheese, cheese spreads, and cheese curds. Regular cottage cheese. Feta cheese. Shredded cheese. String cheese. Fats and oils Butter, lard, shortening, ghee, and bacon fat. Canned and packaged gravies. Seasoning and other foods Onion salt, garlic salt, table salt, and sea salt. Marinades. Regular salad dressings. Relishes, pickles, and olives. Meat flavorings and tenderizers, and bouillon cubes. Horseradish, ketchup, and mustard. Worcestershire sauce. Teriyaki sauce, soy sauce (including reduced sodium). Hot sauce and Tabasco sauce. Steak sauce, fish sauce, oyster sauce, and cocktail sauce. Taco seasonings. Barbecue sauce. Tartar sauce. The items listed above may not be a complete list of foods and beverages you should avoid. Contact a dietitian for more information. Summary  A heart failure eating plan includes changes that limit your intake of sodium and unhealthy fat, and it may help you lose weight or maintain a healthy weight. Your health care provider may also recommend limiting how much fluid you drink.  Most people with heart failure should eat no more than 2,300 mg of salt (sodium) a day. The amount of sodium   that is recommended for you may be lower, depending on your condition.  Contact your health care provider or dietitian before making any major changes to your diet. This information is not intended to replace advice given to you by your health care provider. Make sure you discuss any questions you have with your health care provider. Document Revised: 05/06/2018 Document Reviewed: 07/25/2016 Elsevier Patient Education  2020 Elsevier Inc.  

## 2019-05-03 NOTE — Progress Notes (Signed)
Follow up visit  Subjective:   Anna Barber, female    DOB: 01/12/1956, 64 y.o.   MRN: [id number]   Congestive Heart Failure Associated symptoms include shortness of breath. Pertinent negatives include no chest pain or palpitations.    64 year old Caucasian female with ischemic cardiomyopathy, completed anterior MI in 01/2019, type 2 diabetes mellitus, strong family history of early CAD.  Lexiscan nuclear stress test and echocardiogram show completed LAD territory infarct, now with systolic heart failure EF 25-30%. MRI showed no viability in LAD territory.  Recently evaluated by Dr. Lovena Le for potential ICD implantation for ischemic cardiomyopathy.  Felt to be high risk for ventricular arrhythmias given her cardiomyopathy. Recommend repeating echocardiogram and depending upon her cardiomyopathy doing ICD implantation at that time. She was started on low dose Coreg and moderate dose Entresto at her last visit. Now presents for follow up.  She has not had any chest pain. She still has class II exertional dyspnea, does notice having to give more effort to do things over the last 3-4 days. Intermittent mild leg swelling.    Patient suffered a prolonged episode of chest pain, shortness of breath, diaphoresis in 01/2019 for which she did not seek immediate medical attention. EKG, echocardiogram, stress test, and cardiac MRI point to a completed anterior infarct with no viability, no ischemia in rest of the myocardium, EF 35%.    Current Outpatient Medications on File Prior to Visit  Medication Sig Dispense Refill  . betamethasone dipropionate 0.05 % cream APPLY TWICE DAILY FOR 2 WEEKS AS NEEDED TO HAND DERMATITIS    . carvedilol (COREG) 3.125 MG tablet Take 1 tablet (3.125 mg total) by mouth 2 (two) times daily. 60 tablet 1  . clopidogrel (PLAVIX) 75 MG tablet Take 1 tablet (75 mg total) by mouth daily. 30 tablet 1  . dapagliflozin propanediol (FARXIGA) 10 MG TABS tablet Take 10 mg by mouth  daily. 30 tablet 3  . desonide (DESOWEN) 0.05 % cream APPLY TWICE DAILY FOR SEBORRHEIC DERMATITIS ON FACE FOR 1 TO 2 WEEKS    . ivabradine (CORLANOR) 5 MG TABS tablet Take 1 tablet (5 mg total) by mouth 2 (two) times daily with a meal. 60 tablet 2  . metFORMIN (GLUCOPHAGE) 500 MG tablet Take by mouth as directed. 2 tablets in the morning, 2 tablets in the evening    . nitroGLYCERIN (NITROSTAT) 0.4 MG SL tablet Place 1 tablet (0.4 mg total) under the tongue every 5 (five) minutes as needed for chest pain. 30 tablet 2  . pantoprazole (PROTONIX) 40 MG tablet Take 1 tablet (40 mg total) by mouth daily. 30 tablet 1  . rosuvastatin (CRESTOR) 20 MG tablet Take 1 tablet (20 mg total) by mouth daily. 30 tablet 2  . sacubitril-valsartan (ENTRESTO) 49-51 MG Take 1 tablet by mouth 2 (two) times daily. 60 tablet 1  . spironolactone (ALDACTONE) 25 MG tablet Take 1 tablet (25 mg total) by mouth daily. 30 tablet 3   No current facility-administered medications on file prior to visit.    Cardiovascular & other pertient studies:  Cardiac MRI 03/31/2019: 1. Mildly dilated LV with EF 35%. Wall motion abnormalities as noted above. 2.  Normal RV size and systolic function. 3. LGE pattern suggests prior LAD-territory MI. The apex, the peri-apical segments, and the mid anterior wall and mid anteroseptal wall are unlikely to improve with revascularization (likely non-viable).   EKG 03/21/2019: Sinus tachycardia 105 bpm. Anterior infarct, age indeterminate. Low voltage.  Echocardiogram 03/09/2019: Left ventricle  cavity is normal in size and wall thickness. Large LAD territory akinesis with thinning s/o completed infarct. Severely depressed LV systolic function with EF 25%. Doppler evidence of grade I (impaired) diastolic dysfunction, normal LAP. Patient is mildly tachycardic throughout the study.  Trileaflet aortic valve. Trace aortic regurgitation. Mild (Grade I) mitral regurgitation. Mild tricuspid  regurgitation. Estimated pulmonary artery systolic pressure is 27 mmHg. IVC is dilated with respiratory variation. Estimated RA pressure 8 mmHg.  Lexiscan Sestamibi Stress Test12/16/2020: Nondiagnostic ECG stress. Resting EKG/ECG demonstrated normal sinus rhythm. Observed anterolateral infarct. Peak EKG/ECG no change with Lexiscan infusion. Patient developed chest pain level 2 of 10, and exhibited chest discomfort.  Extensive fixed perfusion abnormality with no reversibility noted in the mid to distal anterior, anteroseptal, apical and mid to distal inferoseptal and inferoapical region suggestive of a completed infarct in the LAD territory. Gated images reveal akinesis in the same region. LVEF severely dysfunctional at 26%. High risk study, no prior study for comparison.  Recent labs: 04/29/2019: BNP 880.4. Creatinine 0.82, eGFR 76, K 3.7, Na 142, Glucose 136, BMP otherwise normal.  03/31/2019: BNP 494 Glucose 115. BUN/Cr 7/0.74. eGFR 87. Na/K 141/3.9  03/07/2019: Chol 90, TG 131, HDL 30, LDL 37 Lipoprotein (a) 52, normal  02/28/2019:  Glucose 130, Cr 0.67. EGFR 89. K 4.2.  H/H 10.5/32.9. MCV normal. Platelets 540   Review of Systems  Cardiovascular: Positive for dyspnea on exertion. Negative for chest pain, leg swelling, orthopnea, palpitations and syncope.  Respiratory: Positive for shortness of breath.          Vitals:   05/03/19 1036  BP: 129/63  Pulse: 67  Temp: (!) 96.3 F (35.7 C)  SpO2: 96%     Body mass index is 35.07 kg/m. Filed Weights   05/03/19 1036  Weight: 198 lb (89.8 kg)     Objective:   Physical Exam  Constitutional: She appears well-developed and well-nourished.  Neck: No JVD present.  Cardiovascular: Normal rate, regular rhythm, normal heart sounds and intact distal pulses.  No murmur heard. Pulmonary/Chest: Effort normal and breath sounds normal. She has no wheezes. She has no rales.  Musculoskeletal:        General: Edema (Trace b/l)  present.  Nursing note and vitals reviewed.         Assessment & Recommendations:   64 year old Caucasian female with ischemic cardiomyopathy, new onset HFrEF, competed anterior MI in 01/2019, type 2 diabetes mellitus, strong family history of early CAD  HFrEF: Ischemic cardiomyopathy with completed anterior infarct-suffered while at home, did not see immediate medical attention. (01/2019) No viability in LAD territory on cardiac MRI. BNP 494 on 03/31/2019, 375 on 04/15/19, now 880 on 04/29/2019. She has noticed slight decreased activity intolerance. No significant leg edema or dyspnea. Increase Entresto to 97-103 mg BID. Continue Corlanor 5 mg daily, Coreg 3.125 mg and Aldactone 25 mg. Will add Lasix 20 mg daily. Potassium supplement not given in view of Entresto and Aldactone. Encouraged liberal hydration. Will again check BMP and BNP in 10 days.  F/u in 2 weeks.  Dietary instructions for CHF given. Currently wearing wearable defibrillator. While she is on GDMT for heart failure, she has completed LAD infarct with EF 35% on cardiac MRI, performed 2 months post MI. Will need echo in 2 months and depending upon this, may need ICD.  CAD: No angina symptoms at this time. On appropriate medical therapy. Patient suffered a prolonged episode of chest pain, shortness of breath, diaphoresis in 01/2019 for which she  did not seek immediate medical attention. EKG, echocardiogram, stress test, and cardiac MRI point to a completed anterior infarct with no viability, no ischemia in rest of the myocardium, EF 35%.  Continue medical therapy at this time. Continue rosuvastatin 20 mg. Given mild anemia, stopped aspirin. Continue plavix.   Will follow up in 2 weeks for close monitoring of CHF.   Miquel Dunn, MSN, APRN, FNP-C [facility] Cardiovascular. [place] Office: [phone] Fax: [phone]

## 2019-05-04 ENCOUNTER — Encounter: Payer: Self-pay | Admitting: Cardiology

## 2019-05-18 LAB — BASIC METABOLIC PANEL
BUN/Creatinine Ratio: 31 — ABNORMAL HIGH (ref 12–28)
BUN: 24 mg/dL (ref 8–27)
CO2: 21 mmol/L (ref 20–29)
Calcium: 9.4 mg/dL (ref 8.7–10.3)
Chloride: 103 mmol/L (ref 96–106)
Creatinine, Ser: 0.78 mg/dL (ref 0.57–1.00)
GFR calc Af Amer: 94 mL/min/{1.73_m2} (ref >59)
GFR calc non Af Amer: 81 mL/min/{1.73_m2} (ref >59)
Glucose: 146 mg/dL — ABNORMAL HIGH (ref 65–99)
Potassium: 5.5 mmol/L — ABNORMAL HIGH (ref 3.5–5.2)
Sodium: 138 mmol/L (ref 134–144)

## 2019-05-18 LAB — BRAIN NATRIURETIC PEPTIDE: BNP: 87.7 pg/mL (ref 0.0–100.0)

## 2019-05-19 ENCOUNTER — Ambulatory Visit: Payer: BC Managed Care – PPO | Admitting: Cardiology

## 2019-05-19 ENCOUNTER — Encounter: Payer: Self-pay | Admitting: Cardiology

## 2019-05-19 ENCOUNTER — Other Ambulatory Visit: Payer: Self-pay

## 2019-05-19 VITALS — BP 95/47 | HR 68 | Temp 96.7°F | Ht 63.0 in | Wt 187.0 lb

## 2019-05-19 DIAGNOSIS — I255 Ischemic cardiomyopathy: Secondary | ICD-10-CM

## 2019-05-19 DIAGNOSIS — I5020 Unspecified systolic (congestive) heart failure: Secondary | ICD-10-CM

## 2019-05-19 DIAGNOSIS — I2510 Atherosclerotic heart disease of native coronary artery without angina pectoris: Secondary | ICD-10-CM

## 2019-05-19 NOTE — Progress Notes (Signed)
Follow up visit  Subjective:   Anna Barber, female    DOB: [birth year], 64 y.o.   MRN: [id number]   64 year old Caucasian female with ischemic cardiomyopathy, completed anterior MI in 01/2019, type 2 diabetes mellitus, strong family history of early CAD.  Patient suffered a prolonged episode of chest pain, shortness of breath, diaphoresis in 01/2019 for which she did not seek immediate medical attention. EKG, echocardiogram, stress test, and cardiac MRI point to a completed anterior infarct with no viability, no ischemia in rest of the myocardium, EF 35%.   She has not had any chest pain. Reviewed recent labs with the patient.  Patient has had significant improvement in her shortness of breath and leg swelling symptoms.  She denies any lightheadedness, presyncope or syncope.     Current Outpatient Medications on File Prior to Visit  Medication Sig Dispense Refill  . carvedilol (COREG) 3.125 MG tablet Take 1 tablet (3.125 mg total) by mouth 2 (two) times daily. 60 tablet 1  . clopidogrel (PLAVIX) 75 MG tablet TAKE ONE TABLET EACH DAY 30 tablet 1  . dapagliflozin propanediol (FARXIGA) 10 MG TABS tablet Take 10 mg by mouth daily. 30 tablet 3  . desonide (DESOWEN) 0.05 % cream APPLY TWICE DAILY FOR SEBORRHEIC DERMATITIS ON FACE FOR 1 TO 2 WEEKS    . furosemide (LASIX) 20 MG tablet Take 1 tablet (20 mg total) by mouth daily. 30 tablet 1  . ivabradine (CORLANOR) 5 MG TABS tablet Take 1 tablet (5 mg total) by mouth 2 (two) times daily with a meal. 60 tablet 2  . metFORMIN (GLUCOPHAGE) 500 MG tablet Take by mouth as directed. 2 tablets in the morning, 2 tablets in the evening    . nitroGLYCERIN (NITROSTAT) 0.4 MG SL tablet Place 1 tablet (0.4 mg total) under the tongue every 5 (five) minutes as needed for chest pain. 30 tablet 2  . pantoprazole (PROTONIX) 40 MG tablet TAKE ONE TABLET EACH DAY 30 tablet 1  . rosuvastatin (CRESTOR) 20 MG tablet Take 1 tablet (20 mg total) by mouth daily. 30  tablet 2  . sacubitril-valsartan (ENTRESTO) 97-103 MG Take 1 tablet by mouth 2 (two) times daily. 60 tablet 1  . spironolactone (ALDACTONE) 25 MG tablet Take 1 tablet (25 mg total) by mouth daily. 30 tablet 3   No current facility-administered medications on file prior to visit.    Cardiovascular & other pertient studies:  Cardiac MRI 03/31/2019: 1. Mildly dilated LV with EF 35%. Wall motion abnormalities as noted above. 2.  Normal RV size and systolic function. 3. LGE pattern suggests prior LAD-territory MI. The apex, the peri-apical segments, and the mid anterior wall and mid anteroseptal wall are unlikely to improve with revascularization (likely non-viable).   EKG 03/21/2019: Sinus tachycardia 105 bpm. Anterior infarct, age indeterminate. Low voltage.  Echocardiogram 03/09/2019: Left ventricle cavity is normal in size and wall thickness. Large LAD territory akinesis with thinning s/o completed infarct. Severely depressed LV systolic function with EF 25%. Doppler evidence of grade I (impaired) diastolic dysfunction, normal LAP. Patient is mildly tachycardic throughout the study.  Trileaflet aortic valve. Trace aortic regurgitation. Mild (Grade I) mitral regurgitation. Mild tricuspid regurgitation. Estimated pulmonary artery systolic pressure is 27 mmHg. IVC is dilated with respiratory variation. Estimated RA pressure 8 mmHg.  Lexiscan Sestamibi Stress Test12/16/2020: Nondiagnostic ECG stress. Resting EKG/ECG demonstrated normal sinus rhythm. Observed anterolateral infarct. Peak EKG/ECG no change with Lexiscan infusion. Patient developed chest pain level 2 of 10, and exhibited chest  discomfort.  Extensive fixed perfusion abnormality with no reversibility noted in the mid to distal anterior, anteroseptal, apical and mid to distal inferoseptal and inferoapical region suggestive of a completed infarct in the LAD territory. Gated images reveal akinesis in the same region. LVEF  severely dysfunctional at 26%. High risk study, no prior study for comparison.  Recent labs: 05/16/2019: Glucose 146. BUN/Cr 24/0.78. eGFR 81. Na/K 138/5.5 BNP 87   04/29/2019: BNP 880.4. Creatinine 0.82, eGFR 76, K 3.7, Na 142, Glucose 136, BMP otherwise normal.  03/31/2019: BNP 494 Glucose 115. BUN/Cr 7/0.74. eGFR 87. Na/K 141/3.9  03/07/2019: Chol 90, TG 131, HDL 30, LDL 37 Lipoprotein (a) 52, normal  02/28/2019:  Glucose 130, Cr 0.67. EGFR 89. K 4.2.  H/H 10.5/32.9. MCV normal. Platelets 540   Review of Systems  Cardiovascular: Negative for chest pain, dyspnea on exertion, leg swelling, palpitations and syncope.        Vitals:   05/19/19 1017 05/19/19 1019  BP: (!) 89/36 (!) 95/47  Pulse: 63 68  Temp: (!) 96.7 F (35.9 C)   SpO2: 97%      Body mass index is 33.13 kg/m. Filed Weights   05/19/19 1017  Weight: 187 lb (84.8 kg)     Objective:   Physical Exam  Constitutional: She appears well-developed and well-nourished.  Neck: No JVD present.  Cardiovascular: Normal rate, regular rhythm, normal heart sounds and intact distal pulses.  No murmur heard. Pulmonary/Chest: Effort normal and breath sounds normal. She has no wheezes. She has no rales.  Musculoskeletal:        General: No edema.  Nursing note and vitals reviewed.         Assessment & Recommendations:   64 year old Caucasian female with ischemic cardiomyopathy, new onset HFrEF, competed anterior MI in 01/2019, type 2 diabetes mellitus, strong family history of early CAD  HFrEF: Ischemic cardiomyopathy with completed anterior infarct-suffered while at home, did not seek immediate medical attention. (01/2019) No viability in LAD territory on cardiac MRI. Currently on Entresto to 97-103 mg BID. Continue Corlanor 5 mg daily, Coreg 3.125 mg, Aldactone 25 mg, Farxiga 10 mg daily.  BNP down to 87 on 05/16/2019. K increased to 5.5. BUN increased to 24. Above numbers are suggestive of relatively  low intravascular volume.  Stop Lasix, only use it in future in case of leg edema.  Encourage liberal hydration.  Gave a sample of Lokelma if potassium stays elevated.  Will recheck BMP and echocardiogram in 1 week.   Follow-up visit in 2 weeks for clinical reassessment and to discuss the need for ICD implantation, based on repeat echocardiogram.    CAD: No angina symptoms at this time. On appropriate medical therapy. Patient suffered a prolonged episode of chest pain, shortness of breath, diaphoresis in 01/2019 for which she did not seek immediate medical attention. EKG, echocardiogram, stress test, and cardiac MRI point to a completed anterior infarct with no viability, no ischemia in rest of the myocardium, EF 35%.  Continue medical therapy at this time. Continue rosuvastatin 20 mg. Given mild anemia, stopped aspirin. Continue plavix.    Nigel Mormon, MD [facility] [place] Pager: [phone] Office: [phone]

## 2019-05-23 ENCOUNTER — Other Ambulatory Visit: Payer: Self-pay

## 2019-05-23 ENCOUNTER — Ambulatory Visit: Payer: BC Managed Care – PPO

## 2019-05-23 DIAGNOSIS — I5020 Unspecified systolic (congestive) heart failure: Secondary | ICD-10-CM

## 2019-05-24 ENCOUNTER — Other Ambulatory Visit: Payer: Self-pay | Admitting: Cardiology

## 2019-05-24 DIAGNOSIS — E875 Hyperkalemia: Secondary | ICD-10-CM

## 2019-05-24 LAB — BASIC METABOLIC PANEL
BUN/Creatinine Ratio: 21 (ref 12–28)
BUN: 18 mg/dL (ref 8–27)
CO2: 21 mmol/L (ref 20–29)
Calcium: 9.4 mg/dL (ref 8.7–10.3)
Chloride: 103 mmol/L (ref 96–106)
Creatinine, Ser: 0.86 mg/dL (ref 0.57–1.00)
GFR calc Af Amer: 83 mL/min/{1.73_m2} (ref >59)
GFR calc non Af Amer: 72 mL/min/{1.73_m2} (ref >59)
Glucose: 117 mg/dL — ABNORMAL HIGH (ref 65–99)
Potassium: 5.6 mmol/L — ABNORMAL HIGH (ref 3.5–5.2)
Sodium: 140 mmol/L (ref 134–144)

## 2019-05-24 NOTE — Progress Notes (Signed)
Called pt to inform her about her lab results. Pt understood

## 2019-05-28 LAB — BASIC METABOLIC PANEL
BUN/Creatinine Ratio: 14 (ref 12–28)
BUN: 12 mg/dL (ref 8–27)
CO2: 19 mmol/L — ABNORMAL LOW (ref 20–29)
Calcium: 9.5 mg/dL (ref 8.7–10.3)
Chloride: 105 mmol/L (ref 96–106)
Creatinine, Ser: 0.88 mg/dL (ref 0.57–1.00)
GFR calc Af Amer: 81 mL/min/{1.73_m2} (ref >59)
GFR calc non Af Amer: 70 mL/min/{1.73_m2} (ref >59)
Glucose: 128 mg/dL — ABNORMAL HIGH (ref 65–99)
Potassium: 5 mmol/L (ref 3.5–5.2)
Sodium: 139 mmol/L (ref 134–144)

## 2019-05-29 LAB — VITAMIN D 1,25 DIHYDROXY
Vitamin D 1, 25 (OH)2 Total: 33 pg/mL
Vitamin D2 1, 25 (OH)2: <10 pg/mL
Vitamin D3 1, 25 (OH)2: 33 pg/mL

## 2019-05-29 NOTE — Progress Notes (Signed)
Follow up visit  Subjective:   Anna Barber, female    DOB: [birth year], 64 y.o.   MRN: [id number]   64 year old Caucasian female with ischemic cardiomyopathy, completed anterior MI in 01/2019, type 2 diabetes mellitus, strong family history of early CAD.  Patient suffered a prolonged episode of chest pain, shortness of breath, diaphoresis in 01/2019 for which she did not seek immediate medical attention. EKG, echocardiogram, stress test, and cardiac MRI point to a completed anterior infarct with no viability, no ischemia in rest of the myocardium, EF 35%.   She has not had any chest pain. Reviewed recent labs with the patient.  Patient is feeling significantly better without dyspnea or leg edema She denies any lightheadedness, presyncope or syncope.    K was increased to 5.6, improved with Lokelma. EF has improved to 45% on echocardiogram on 05/23/2019.   Current Outpatient Medications on File Prior to Visit  Medication Sig Dispense Refill  . carvedilol (COREG) 3.125 MG tablet Take 1 tablet (3.125 mg total) by mouth 2 (two) times daily. 60 tablet 1  . clopidogrel (PLAVIX) 75 MG tablet TAKE ONE TABLET EACH DAY 30 tablet 1  . dapagliflozin propanediol (FARXIGA) 10 MG TABS tablet Take 10 mg by mouth daily. 30 tablet 3  . desonide (DESOWEN) 0.05 % cream APPLY TWICE DAILY FOR SEBORRHEIC DERMATITIS ON FACE FOR 1 TO 2 WEEKS    . ivabradine (CORLANOR) 5 MG TABS tablet Take 1 tablet (5 mg total) by mouth 2 (two) times daily with a meal. 60 tablet 2  . metFORMIN (GLUCOPHAGE) 500 MG tablet Take by mouth as directed. 2 tablets in the morning, 2 tablets in the evening    . nitroGLYCERIN (NITROSTAT) 0.4 MG SL tablet Place 1 tablet (0.4 mg total) under the tongue every 5 (five) minutes as needed for chest pain. 30 tablet 2  . pantoprazole (PROTONIX) 40 MG tablet TAKE ONE TABLET EACH DAY 30 tablet 1  . rosuvastatin (CRESTOR) 20 MG tablet Take 1 tablet (20 mg total) by mouth daily. 30 tablet 2  .  sacubitril-valsartan (ENTRESTO) 97-103 MG Take 1 tablet by mouth 2 (two) times daily. 60 tablet 1  . spironolactone (ALDACTONE) 25 MG tablet Take 1 tablet (25 mg total) by mouth daily. 30 tablet 3   No current facility-administered medications on file prior to visit.    Cardiovascular & other pertient studies:  Echocardiogram 05/24/2019:  Left ventricle cavity is normal in size. Normal left ventricular wall  thickness. Akinetic mid to distal anteroseptal, anteroapical myocardium.  LVEF 40-45%. Mildly depressed LV systolic function with EF 45%. Normal  global wall motion. Indeterminate diastolic filling pattern.  Left atrial cavity is mildly dilated.  Trileaflet aortic valve. Mild (Grade I) aortic regurgitation.  Moderate (Grade II) mitral regurgitation.  Mild tricuspid regurgitation.  No evidence of pulmonary hypertension.  Compared to previous study on 03/09/2019, EF has improved from 25%.  Cardiac MRI 03/31/2019: 1. Mildly dilated LV with EF 35%. Wall motion abnormalities as noted above. 2.  Normal RV size and systolic function. 3. LGE pattern suggests prior LAD-territory MI. The apex, the peri-apical segments, and the mid anterior wall and mid anteroseptal wall are unlikely to improve with revascularization (likely non-viable).   EKG 03/21/2019: Sinus tachycardia 105 bpm. Anterior infarct, age indeterminate. Low voltage.  Echocardiogram 03/09/2019: Left ventricle cavity is normal in size and wall thickness. Large LAD territory akinesis with thinning s/o completed infarct. Severely depressed LV systolic function with EF 25%. Doppler evidence of grade  I (impaired) diastolic dysfunction, normal LAP. Patient is mildly tachycardic throughout the study.  Trileaflet aortic valve. Trace aortic regurgitation. Mild (Grade I) mitral regurgitation. Mild tricuspid regurgitation. Estimated pulmonary artery systolic pressure is 27 mmHg. IVC is dilated with respiratory variation. Estimated  RA pressure 8 mmHg.  Lexiscan Sestamibi Stress Test12/16/2020: Nondiagnostic ECG stress. Resting EKG/ECG demonstrated normal sinus rhythm. Observed anterolateral infarct. Peak EKG/ECG no change with Lexiscan infusion. Patient developed chest pain level 2 of 10, and exhibited chest discomfort.  Extensive fixed perfusion abnormality with no reversibility noted in the mid to distal anterior, anteroseptal, apical and mid to distal inferoseptal and inferoapical region suggestive of a completed infarct in the LAD territory. Gated images reveal akinesis in the same region. LVEF severely dysfunctional at 26%. High risk study, no prior study for comparison.  Recent labs: 05/27/2019: Glucose 128. BUN/Cr 12/0.88. eGFR 81. Na/K 139/5.0  05/16/2019: Glucose 146. BUN/Cr 24/0.78. eGFR 81. Na/K 138/5.5 BNP 87   04/29/2019: BNP 880.4. Creatinine 0.82, eGFR 76, K 3.7, Na 142, Glucose 136, BMP otherwise normal.  03/31/2019: BNP 494 Glucose 115. BUN/Cr 7/0.74. eGFR 87. Na/K 141/3.9  03/07/2019: Chol 90, TG 131, HDL 30, LDL 37 Lipoprotein (a) 52, normal  02/28/2019:  Glucose 130, Cr 0.67. EGFR 89. K 4.2.  H/H 10.5/32.9. MCV normal. Platelets 540   Review of Systems  Cardiovascular: Negative for chest pain, dyspnea on exertion, leg swelling, palpitations and syncope.        Vitals:   05/30/19 1018  BP: (!) 110/49  Pulse: 64  Resp: 16  Temp: 97.8 F (36.6 C)  SpO2: 96%     Body mass index is 33.02 kg/m. Filed Weights   05/30/19 1018  Weight: 186 lb 6.4 oz (84.6 kg)     Objective:   Physical Exam  Constitutional: She appears well-developed and well-nourished.  Neck: No JVD present.  Cardiovascular: Normal rate, regular rhythm, normal heart sounds and intact distal pulses.  No murmur heard. Pulmonary/Chest: Effort normal and breath sounds normal. She has no wheezes. She has no rales.  Musculoskeletal:        General: No edema.  Nursing note and vitals reviewed.           Assessment & Recommendations:   64 year old Caucasian female with ischemic cardiomyopathy, new onset HFrEF, competed anterior MI in 01/2019, type 2 diabetes mellitus, strong family history of early CAD  HFrEF: Ischemic cardiomyopathy with completed anterior infarct-suffered while at home, did not seek immediate medical attention. (01/2019) No viability in LAD territory on cardiac MRI. Currently on Entresto to 97-103 mg BID. Continue Corlanor 5 mg daily, Coreg 3.125 mg, Aldactone 25 mg, Farxiga 10 mg daily.  BNP down to 87 on 05/16/2019. K decreased to 5.0 on Lokelma. Lokelma can be stopped not, as long as patient avoids K rich foods. Will repeat BMP in 1 month. Given improvement in EF to 45%, does not ICD. LifeVest can be discontinued. Feeling well. Would like to start exercise, will see if she will qualify for cardiac rehab. Can start light daily walking now that she has been stable.  CAD: No angina symptoms at this time. On appropriate medical therapy. Patient suffered a prolonged episode of chest pain, shortness of breath, diaphoresis in 01/2019 for which she did not seek immediate medical attention. EKG, echocardiogram, stress test, and cardiac MRI point to a completed anterior infarct with no viability, no ischemia in rest of the myocardium, EF 35%.  Continue medical therapy at this time. Continue rosuvastatin 20 mg.  Given mild anemia, stopped aspirin. Continue plavix.   Will follow up in 8 weeks for continued close monitoring.    Miquel Dunn, MSN, APRN, FNP-C [facility]. [place] Office: [phone] Fax: [phone]

## 2019-05-30 ENCOUNTER — Encounter: Payer: Self-pay | Admitting: Cardiology

## 2019-05-30 ENCOUNTER — Other Ambulatory Visit: Payer: Self-pay

## 2019-05-30 ENCOUNTER — Ambulatory Visit: Payer: BC Managed Care – PPO | Admitting: Cardiology

## 2019-05-30 ENCOUNTER — Other Ambulatory Visit: Payer: Self-pay | Admitting: Cardiology

## 2019-05-30 VITALS — BP 110/49 | HR 64 | Temp 97.8°F | Resp 16 | Ht 63.0 in | Wt 186.4 lb

## 2019-05-30 DIAGNOSIS — E119 Type 2 diabetes mellitus without complications: Secondary | ICD-10-CM

## 2019-05-30 DIAGNOSIS — I2510 Atherosclerotic heart disease of native coronary artery without angina pectoris: Secondary | ICD-10-CM

## 2019-05-30 DIAGNOSIS — I255 Ischemic cardiomyopathy: Secondary | ICD-10-CM

## 2019-05-30 DIAGNOSIS — E782 Mixed hyperlipidemia: Secondary | ICD-10-CM

## 2019-05-30 DIAGNOSIS — I5020 Unspecified systolic (congestive) heart failure: Secondary | ICD-10-CM

## 2019-05-30 DIAGNOSIS — I252 Old myocardial infarction: Secondary | ICD-10-CM

## 2019-06-20 ENCOUNTER — Other Ambulatory Visit: Payer: Self-pay | Admitting: Cardiology

## 2019-07-01 LAB — BASIC METABOLIC PANEL
BUN/Creatinine Ratio: 19 (ref 12–28)
BUN: 16 mg/dL (ref 8–27)
CO2: 21 mmol/L (ref 20–29)
Calcium: 9.4 mg/dL (ref 8.7–10.3)
Chloride: 106 mmol/L (ref 96–106)
Creatinine, Ser: 0.83 mg/dL (ref 0.57–1.00)
GFR calc Af Amer: 87 mL/min/{1.73_m2} (ref >59)
GFR calc non Af Amer: 75 mL/min/{1.73_m2} (ref >59)
Glucose: 127 mg/dL — ABNORMAL HIGH (ref 65–99)
Potassium: 4.7 mmol/L (ref 3.5–5.2)
Sodium: 141 mmol/L (ref 134–144)

## 2019-07-01 LAB — BRAIN NATRIURETIC PEPTIDE: BNP: 169.3 pg/mL — ABNORMAL HIGH (ref 0.0–100.0)

## 2019-07-04 ENCOUNTER — Other Ambulatory Visit: Payer: Self-pay | Admitting: Cardiology

## 2019-07-04 NOTE — Progress Notes (Signed)
I do not see any EP appt for this patient. Please follow up and update me on this.  Thanks MJP

## 2019-07-04 NOTE — Progress Notes (Signed)
BNP is mildly elevated. Please ask the patient to take lasix for next 2-3 days.

## 2019-07-04 NOTE — Progress Notes (Signed)
Disregard. Does not need EP appt

## 2019-07-08 ENCOUNTER — Other Ambulatory Visit: Payer: Self-pay | Admitting: Cardiology

## 2019-07-08 DIAGNOSIS — I5020 Unspecified systolic (congestive) heart failure: Secondary | ICD-10-CM

## 2019-07-08 DIAGNOSIS — I255 Ischemic cardiomyopathy: Secondary | ICD-10-CM

## 2019-07-26 ENCOUNTER — Other Ambulatory Visit: Payer: Self-pay | Admitting: Cardiology

## 2019-08-02 NOTE — Progress Notes (Signed)
Follow up visit  Subjective:   Anna Barber, female    DOB: [birth year], 64 y.o.   MRN: [id number]   64 year old Caucasian female with ischemic cardiomyopathy, completed anterior MI in 01/2019, type 2 diabetes mellitus, strong family history of early CAD.  Patient suffered a prolonged episode of chest pain, shortness of breath, diaphoresis in 01/2019 for which she did not seek immediate medical attention. EKG, echocardiogram, stress test, and cardiac MRI point to a completed anterior infarct with no viability, no ischemia in rest of the myocardium, EF 35%.   Patient is doing well. She denies chest pain, shortness of breath, palpitations, leg edema, orthopnea, PND, TIA/syncope.  She has noticed dry skin with irritation on arms, legs, and back, along with hair fall. She does endorse taking long baths in hot soaking tub. This has been her lifelong habit, but her symptoms did not start until recently. She consulted a dermatologist who stated that these symptoms are not uncommon after medical illness.    Current Outpatient Medications on File Prior to Visit  Medication Sig Dispense Refill  . carvedilol (COREG) 3.125 MG tablet TAKE ONE TABLET TWICE DAILY 60 tablet 1  . clopidogrel (PLAVIX) 75 MG tablet TAKE ONE TABLET EACH DAY 30 tablet 0  . CORLANOR 5 MG TABS tablet TAKE ONE TABLET TWICE DAILY WITH A MEAL 60 tablet 0  . dapagliflozin propanediol (FARXIGA) 10 MG TABS tablet Take 10 mg by mouth daily. 30 tablet 3  . desonide (DESOWEN) 0.05 % cream APPLY TWICE DAILY FOR SEBORRHEIC DERMATITIS ON FACE FOR 1 TO 2 WEEKS    . ENTRESTO 97-103 MG TAKE ONE TABLET TWICE DAILY 60 tablet 0  . metFORMIN (GLUCOPHAGE) 500 MG tablet Take by mouth as directed. 2 tablets in the morning, 2 tablets in the evening    . nitroGLYCERIN (NITROSTAT) 0.4 MG SL tablet Place 1 tablet (0.4 mg total) under the tongue every 5 (five) minutes as needed for chest pain. 30 tablet 2  . pantoprazole (PROTONIX) 40 MG tablet TAKE  ONE TABLET EACH DAY 30 tablet 0  . rosuvastatin (CRESTOR) 20 MG tablet TAKE ONE TABLET EACH DAY 90 tablet 1  . spironolactone (ALDACTONE) 25 MG tablet Take 1 tablet (25 mg total) by mouth daily. 30 tablet 3   No current facility-administered medications on file prior to visit.    Cardiovascular & other pertient studies:  Echocardiogram 05/24/2019:  Left ventricle cavity is normal in size. Normal left ventricular wall  thickness. Akinetic mid to distal anteroseptal, anteroapical myocardium.  LVEF 40-45%. Mildly depressed LV systolic function with EF 45%. Normal  global wall motion. Indeterminate diastolic filling pattern.  Left atrial cavity is mildly dilated.  Trileaflet aortic valve. Mild (Grade I) aortic regurgitation.  Moderate (Grade II) mitral regurgitation.  Mild tricuspid regurgitation.  No evidence of pulmonary hypertension.  Compared to previous study on 03/09/2019, EF has improved from 25%.  Cardiac MRI 03/31/2019: 1. Mildly dilated LV with EF 35%. Wall motion abnormalities as noted above. 2.  Normal RV size and systolic function. 3. LGE pattern suggests prior LAD-territory MI. The apex, the peri-apical segments, and the mid anterior wall and mid anteroseptal wall are unlikely to improve with revascularization (likely non-viable).  EKG 03/21/2019: Sinus tachycardia 105 bpm. Anterior infarct, age indeterminate. Low voltage.  Lexiscan Sestamibi Stress Test12/16/2020: Nondiagnostic ECG stress. Resting EKG/ECG demonstrated normal sinus rhythm. Observed anterolateral infarct. Peak EKG/ECG no change with Lexiscan infusion. Patient developed chest pain level 2 of 10, and exhibited chest discomfort.  Extensive fixed perfusion abnormality with no reversibility noted in the mid to distal anterior, anteroseptal, apical and mid to distal inferoseptal and inferoapical region suggestive of a completed infarct in the LAD territory. Gated images reveal akinesis in the same region.  LVEF severely dysfunctional at 26%. High risk study, no prior study for comparison.  Recent labs: 06/30/2019: Glucose 147. BUN/Cr 16/0.83. eGFR 75. Na/K 141/4.7 BNP 169  05/16/2019: Glucose 146. BUN/Cr 24/0.78. eGFR 81. Na/K 138/5.5 BNP 87   04/29/2019: BNP 880.4. Creatinine 0.82, eGFR 76, K 3.7, Na 142, Glucose 136, BMP otherwise normal.  03/31/2019: BNP 494 Glucose 115. BUN/Cr 7/0.74. eGFR 87. Na/K 141/3.9  03/07/2019: Chol 90, TG 131, HDL 30, LDL 37 Lipoprotein (a) 52, normal  02/28/2019:  Glucose 130, Cr 0.67. EGFR 89. K 4.2.  H/H 10.5/32.9. MCV normal. Platelets 540   Review of Systems  Cardiovascular: Negative for chest pain, dyspnea on exertion, leg swelling, palpitations and syncope.        Vitals:   08/03/19 0938  BP: (!) 113/45  Pulse: 60  Resp: 17  Temp: (!) 96.7 F (35.9 C)  SpO2: 99%     Body mass index is 33.3 kg/m. Filed Weights   08/03/19 0938  Weight: 188 lb (85.3 kg)     Objective:   Physical Exam  Constitutional: She appears well-developed and well-nourished.  Neck: No JVD present.  Cardiovascular: Normal rate, regular rhythm, normal heart sounds and intact distal pulses.  No murmur heard. Pulmonary/Chest: Effort normal and breath sounds normal. She has no wheezes. She has no rales.  Musculoskeletal:        General: No edema.  Nursing note and vitals reviewed.         Assessment & Recommendations:   64 year old Caucasian female with ischemic cardiomyopathy, new onset HFrEF, competed anterior MI in 01/2019, type 2 diabetes mellitus, strong family history of early CAD  HFrEF: Ischemic cardiomyopathy with completed anterior infarct-suffered while at home, did not seek immediate medical attention. (01/2019) No viability in LAD territory on cardiac MRI. LVEF improved to 40-45% on echo (05/2019) Currently on Entresto to 97-103 mg BID. Continue Corlanor 5 mg daily, Coreg 3.125 mg, Aldactone 25 mg, Farxiga 10 mg daily, as needed  lasix.  Will refer to cardiac rehab. Hair loss and pruritus could be side effects of Entresto. Fortunately, she does not have any severe allergic reactions. In light of improvement in EF while on Entresto, patient would like to continue this medication-while she improves her nutrition. With time, if symptoms do not improve, could consider alternate options.    CAD: No angina symptoms at this time. On appropriate medical therapy. Patient suffered a prolonged episode of chest pain, shortness of breath, diaphoresis in 01/2019 for which she did not seek immediate medical attention. EKG, echocardiogram, stress test, and cardiac MRI point to a completed anterior infarct with no viability, no ischemia in rest of the myocardium. Continue medical therapy at this time. Continue rosuvastatin 20 mg. Given mild anemia, stopped aspirin. Continue plavix.   F/u in 3 months  S[place]cer, MD [facility] [place] Pager: [phone] Office: [phone]

## 2019-08-03 ENCOUNTER — Other Ambulatory Visit: Payer: Self-pay

## 2019-08-03 ENCOUNTER — Encounter (HOSPITAL_COMMUNITY): Payer: Self-pay | Admitting: *Deleted

## 2019-08-03 ENCOUNTER — Telehealth: Payer: Self-pay

## 2019-08-03 ENCOUNTER — Encounter: Payer: Self-pay | Admitting: Cardiology

## 2019-08-03 ENCOUNTER — Ambulatory Visit: Payer: BC Managed Care – PPO | Admitting: Cardiology

## 2019-08-03 VITALS — BP 113/45 | HR 60 | Temp 96.7°F | Resp 17 | Ht 63.0 in | Wt 188.0 lb

## 2019-08-03 DIAGNOSIS — I5020 Unspecified systolic (congestive) heart failure: Secondary | ICD-10-CM

## 2019-08-03 DIAGNOSIS — I255 Ischemic cardiomyopathy: Secondary | ICD-10-CM

## 2019-08-03 DIAGNOSIS — I2510 Atherosclerotic heart disease of native coronary artery without angina pectoris: Secondary | ICD-10-CM

## 2019-08-03 NOTE — Progress Notes (Signed)
Received referral from Dr. Rosemary Holms for this pt to participate in Cardiac rehab with the diagnosis of Heart Failure with Reduced EF. Clinical review of pt follow up appt on 5/12  Pt appropriate for scheduling for on site cardiac rehab and/or enrollment in Virtual Cardiac Rehab.  Pt Covid score is 5.  Will forward to staff for follow up. Alanson Aly, BSN Cardiac and Emergency planning/management officer

## 2019-08-04 ENCOUNTER — Encounter (HOSPITAL_COMMUNITY): Payer: Self-pay

## 2019-08-04 ENCOUNTER — Telehealth (HOSPITAL_COMMUNITY): Payer: Self-pay

## 2019-08-04 NOTE — Telephone Encounter (Signed)
Attempted to call patient in regards to Cardiac Rehab - LM on VM 

## 2019-08-04 NOTE — Telephone Encounter (Signed)
Pt insurance is active and benefits verified through [place]. Co-pay $0.00, DED $6,300.00/$6,300.00 met, out of pocket $8,550.00/$8,550.00 met, co-insurance 50%. No pre-authorization required. Passport, 08/04/19 _0 :49PM, PPN#[id number]  Will contact patient to see if she is interested in the Cardiac Rehab Program.

## 2019-08-05 ENCOUNTER — Other Ambulatory Visit: Payer: Self-pay | Admitting: Cardiology

## 2019-08-05 DIAGNOSIS — I255 Ischemic cardiomyopathy: Secondary | ICD-10-CM

## 2019-08-05 DIAGNOSIS — I5020 Unspecified systolic (congestive) heart failure: Secondary | ICD-10-CM

## 2019-08-08 ENCOUNTER — Ambulatory Visit: Payer: BC Managed Care – PPO | Admitting: Cardiology

## 2019-08-10 ENCOUNTER — Telehealth (HOSPITAL_COMMUNITY): Payer: Self-pay

## 2019-08-10 NOTE — Telephone Encounter (Signed)
Pt returned CR phone call and stated she is interested in CR. Patient will come in for orientation on 09/08/19 @ 10AM and will attend the 10:45AM exercise class. Went over insurance, patient verbalized understanding.   Mailed letter

## 2019-08-12 ENCOUNTER — Other Ambulatory Visit: Payer: Self-pay | Admitting: Cardiology

## 2019-08-12 DIAGNOSIS — I5020 Unspecified systolic (congestive) heart failure: Secondary | ICD-10-CM

## 2019-08-20 ENCOUNTER — Other Ambulatory Visit: Payer: Self-pay | Admitting: Cardiology

## 2019-08-23 ENCOUNTER — Other Ambulatory Visit: Payer: Self-pay | Admitting: Cardiology

## 2019-09-05 ENCOUNTER — Other Ambulatory Visit: Payer: Self-pay | Admitting: Cardiology

## 2019-09-06 ENCOUNTER — Other Ambulatory Visit: Payer: Self-pay

## 2019-09-06 ENCOUNTER — Encounter (HOSPITAL_COMMUNITY)
Admission: RE | Admit: 2019-09-06 | Discharge: 2019-09-06 | Disposition: A | Discharge status: Home / Self Care | Payer: BC Managed Care – PPO | Source: Ambulatory Visit | Attending: Cardiology | Admitting: Cardiology

## 2019-09-06 DIAGNOSIS — I5022 Chronic systolic (congestive) heart failure: Secondary | ICD-10-CM | POA: Insufficient documentation

## 2019-09-06 NOTE — Progress Notes (Signed)
Cardiac Rehab Telephone Note:  Successful telephone encounter to Anna Barber to confirm Cardiac Rehab orientation appointment for 09/08/19 at 10:00 am. Nursing assessment completed. Patient questions answered. Instructions for appointment provided. Patient screening for Covid-19 negative.  Davon Abdelaziz E. Suzie Portela RN, BSN [place]. [facility]  Cardiac and Pulmonary Rehabilitation Phone: [phone] Fax: [phone]

## 2019-09-07 ENCOUNTER — Telehealth (HOSPITAL_COMMUNITY): Payer: Self-pay | Admitting: Student-PharmD

## 2019-09-07 NOTE — Telephone Encounter (Signed)
Cardiac Rehab Medication Review by a Pharmacist  Does the patient  feel that his/her medications are working for him/her?  Yes   Has the patient been experiencing any side effects to the medications prescribed?  No   Does the patient measure his/her own blood pressure or blood glucose at home?  Yes. 1-2x/week. SBP 95-110, DBP 45-55.  Does the patient have any problems obtaining medications due to transportation or finances?   No   Understanding of regimen: excellent Understanding of indications: excellent Potential of compliance: excellent    Pharmacist comments: Patient well aware of medications and regimen.     Emmylou Bieker L Serina Nichter 09/07/2019 12:27 PM

## 2019-09-08 ENCOUNTER — Encounter (HOSPITAL_COMMUNITY): Payer: Self-pay

## 2019-09-08 ENCOUNTER — Other Ambulatory Visit: Payer: Self-pay

## 2019-09-08 ENCOUNTER — Encounter (HOSPITAL_COMMUNITY)
Admission: RE | Admit: 2019-09-08 | Discharge: 2019-09-08 | Disposition: A | Discharge status: Home / Self Care | Payer: BC Managed Care – PPO | Source: Ambulatory Visit | Attending: Cardiology | Admitting: Cardiology

## 2019-09-08 VITALS — BP 116/80 | HR 68 | Ht 64.0 in | Wt 197.1 lb

## 2019-09-08 DIAGNOSIS — I5022 Chronic systolic (congestive) heart failure: Secondary | ICD-10-CM

## 2019-09-08 LAB — GLUCOSE, CAPILLARY: Glucose-Capillary: 120 mg/dL — ABNORMAL HIGH (ref 70–99)

## 2019-09-08 NOTE — Progress Notes (Signed)
Cardiac Individual Treatment Plan  Patient Details  Name: Anna Barber MRN: [id number] Date of Birth: [birth year] Referring Provider:     [facility] from 09/08/2019 in [facility]  Referring Provider [place], [place] J      Initial Encounter Date:    CARDIAC REHAB PHASE II ORIENTATION from 09/08/2019 in [facility]  Date 09/08/19      Visit Diagnosis: Heart failure, chronic systolic ([place])  Patient's Home Medications on Admission:  Current Outpatient Medications:  .  carvedilol (COREG) 3.125 MG tablet, TAKE ONE TABLET TWICE DAILY, Disp: 60 tablet, Rfl: 1 .  clopidogrel (PLAVIX) 75 MG tablet, TAKE ONE TABLET EACH DAY, Disp: 30 tablet, Rfl: 0 .  CORLANOR 5 MG TABS tablet, TAKE ONE TABLET TWICE DAILY WITH A MEAL, Disp: 60 tablet, Rfl: 3 .  desonide (DESOWEN) 0.05 % cream, APPLY TWICE DAILY FOR SEBORRHEIC DERMATITIS ON FACE FOR 1 TO 2 WEEKS, Disp: , Rfl:  .  ENTRESTO 97-103 MG, TAKE ONE TABLET TWICE DAILY, Disp: 60 tablet, Rfl: 0 .  FARXIGA 10 MG TABS tablet, TAKE ONE TABLET DAILY, Disp: 30 tablet, Rfl: 3 .  metFORMIN (GLUCOPHAGE) 500 MG tablet, Take by mouth as directed. 2 tablets in the morning, 2 tablets in the evening, Disp: , Rfl:  .  nitroGLYCERIN (NITROSTAT) 0.4 MG SL tablet, Place 1 tablet (0.4 mg total) under the tongue every 5 (five) minutes as needed for chest pain., Disp: 30 tablet, Rfl: 2 .  pantoprazole (PROTONIX) 40 MG tablet, TAKE ONE TABLET EACH DAY, Disp: 30 tablet, Rfl: 0 .  rosuvastatin (CRESTOR) 20 MG tablet, TAKE ONE TABLET EACH DAY, Disp: 90 tablet, Rfl: 1 .  spironolactone (ALDACTONE) 25 MG tablet, TAKE ONE TABLET DAILY, Disp: 30 tablet, Rfl: 3  Past Medical History: Past Medical History:  Diagnosis Date  . Diabetes mellitus without complication ([place])   . Hyperlipidemia   . Hypertension     Tobacco Use: Social History   Tobacco Use  Smoking Status Former Smoker  .  Packs/day: 1.50  . Years: 15.00  . Pack years: 22.50  . Types: Cigarettes  . Quit date: 03/04/1987  . Years since quitting: 32.5  Smokeless Tobacco Never Used    Labs: Recent Review [facility] for [facility] Cardiac and Pulmonary Rehab Latest Ref Rng & Units 03/07/2019   Cholestrol 100 - 199 mg/dL 90(L)   LDLCALC 0 - 99 mg/dL 37   HDL >39 mg/dL 30(L)   Trlycerides 0 - 149 mg/dL 131      Capillary Blood Glucose: Lab Results  Component Value Date   GLUCAP 120 (H) 09/08/2019     Exercise Target Goals: Exercise Program Goal: Individual exercise prescription set using results from initial 6 min walk test and THRR while considering  patient's activity barriers and safety.   Exercise Prescription Goal: Starting with aerobic activity 30 plus minutes a day, 3 days per week for initial exercise prescription. Provide home exercise prescription and guidelines that participant acknowledges understanding prior to discharge.  Activity Barriers & Risk Stratification:  Activity Barriers & Cardiac Risk Stratification - 09/08/19 1116      Activity Barriers & Cardiac Risk Stratification   Activity Barriers Other (comment)    Comments Occasional Hip Pain    Cardiac Risk Stratification High           6 Minute Walk:  6 Minute Walk    Row Name 09/08/19 1115  6 Minute Walk   Phase Initial     Distance 1363 feet     Walk Time 6 minutes     # of Rest Breaks 0     MPH 2.6     METS 2.5     RPE 11     Perceived Dyspnea  0     VO2 Peak 8.8     Symptoms No     Resting HR 68 bpm     Resting BP 116/80     Resting Oxygen Saturation  98 %     Exercise Oxygen Saturation  during 6 min walk 97 %     Max Ex. HR 105 bpm     Max Ex. BP 139/78     2 Minute Post BP 124/78            Oxygen Initial Assessment:   Oxygen Re-Evaluation:   Oxygen Discharge (Final Oxygen Re-Evaluation):   Initial Exercise Prescription:  Initial Exercise Prescription - 09/08/19 1100        Date of Initial Exercise RX and Referring Provider   Date 09/08/19    Referring Provider Truett [place] J    Expected Discharge Date 11/04/19      Treadmill   MPH 1.7    Grade 0    Minutes 15    METs 2.3      NuStep   Level 2    SPM 75    Minutes 15    METs 2.3      Prescription Details   Frequency (times per week) 2lbs    Duration Progress to 10 minutes continuous walking  at current work load and total walking time to 30-45 min      Intensity   THRR 40-80% of Max Heartrate 63-126    Ratings of Perceived Exertion 11-13    Perceived Dyspnea 0-4      Progression   Progression Continue progressive overload as per policy without signs/symptoms or physical distress.      Resistance Training   Training Prescription Yes    Weight 2lbs    Reps 10-15           Perform Capillary Blood Glucose checks as needed.  Exercise Prescription Changes:   Exercise Comments:   Exercise Goals and Review:   Exercise Goals    Row Name 09/08/19 1117             Exercise Goals   Increase Physical Activity Yes       Intervention Provide advice, education, support and counseling about physical activity/exercise needs.;Develop an individualized exercise prescription for aerobic and resistive training based on initial evaluation findings, risk stratification, comorbidities and participant's personal goals.       Expected Outcomes Short Term: Attend rehab on a regular basis to increase amount of physical activity.;Long Term: Add in home exercise to make exercise part of routine and to increase amount of physical activity.;Long Term: Exercising regularly at least 3-5 days a week.       Increase Strength and Stamina Yes       Intervention Provide advice, education, support and counseling about physical activity/exercise needs.;Develop an individualized exercise prescription for aerobic and resistive training based on initial evaluation findings, risk stratification, comorbidities and  participant's personal goals.       Expected Outcomes Short Term: Increase workloads from initial exercise prescription for resistance, speed, and METs.;Short Term: Perform resistance training exercises routinely during rehab and add in resistance training at home;Long Term: Improve cardiorespiratory  fitness, muscular endurance and strength as measured by increased METs and functional capacity ( )       Able to understand and use rate of perceived exertion (RPE) scale Yes       Intervention Provide education and explanation on how to use RPE scale       Expected Outcomes Short Term: Able to use RPE daily in rehab to express subjective intensity level;Long Term:  Able to use RPE to guide intensity level when exercising independently       Knowledge and understanding of Target Heart Rate Range (THRR) Yes       Intervention Provide education and explanation of THRR including how the numbers were predicted and where they are located for reference       Expected Outcomes Short Term: Able to state/look up THRR;Long Term: Able to use THRR to govern intensity when exercising independently;Short Term: Able to use daily as guideline for intensity in rehab       Able to check pulse independently Yes       Intervention Provide education and demonstration on how to check pulse in carotid and radial arteries.;Review the importance of being able to check your own pulse for safety during independent exercise       Expected Outcomes Short Term: Able to explain why pulse checking is important during independent exercise;Long Term: Able to check pulse independently and accurately       Understanding of Exercise Prescription Yes       Intervention Provide education, explanation, and written materials on patient's individual exercise prescription       Expected Outcomes Short Term: Able to explain program exercise prescription;Long Term: Able to explain home exercise prescription to exercise independently               Exercise Goals Re-Evaluation :    Discharge Exercise Prescription (Final Exercise Prescription Changes):   Nutrition:  Target Goals: Understanding of nutrition guidelines, daily intake of sodium 1500mg , cholesterol 200mg , calories 30% from fat and 7% or less from saturated fats, daily to have 5 or more servings of fruits and vegetables.  Biometrics:  Pre Biometrics - 09/08/19 1118      Pre Biometrics   Height 5\' 4"  (1.626 m)    Weight 89.4 kg    Waist Circumference 42 inches    Hip Circumference 47 inches    Waist to Hip Ratio 0.89 %    BMI (Calculated) 33.81    Triceps Skinfold 30 mm    % Body Fat 44.7 %    Grip Strength 24 kg    Flexibility 17 in    Single Leg Stand 19.7 seconds            Nutrition Therapy Plan and Nutrition Goals:   Nutrition Assessments:   Nutrition Goals Re-Evaluation:   Nutrition Goals Discharge (Final Nutrition Goals Re-Evaluation):   Psychosocial: Target Goals: Acknowledge presence or absence of significant depression and/or stress, maximize coping skills, provide positive support system. Participant is able to verbalize types and ability to use techniques and skills needed for reducing stress and depression.  Initial Review & Psychosocial Screening:  Initial Psych Review & Screening - 09/08/19 1214      Initial Review   Current issues with None Identified      Family Dynamics   Good Support System? Yes   Ann-Marie lives alone but has friends and family who live nearby for support     Barriers   Psychosocial barriers to participate in program  There are no identifiable barriers or psychosocial needs.      Screening Interventions   Interventions Encouraged to exercise           Quality of Life Scores:  Quality of Life - 09/08/19 1121      Quality of Life   Select Quality of Life      Quality of Life Scores   Health/Function Pre 26.57 %    Socioeconomic Pre 30 %    Psych/Spiritual Pre 27.86 %    Family Pre 28.5  %    GLOBAL Pre 27.77 %          Scores of 19 and below usually indicate a poorer quality of life in these areas.  A difference of  2-3 points is a clinically meaningful difference.  A difference of 2-3 points in the total score of the Quality of Life Index has been associated with significant improvement in overall quality of life, self-image, physical symptoms, and general health in studies assessing change in quality of life.  PHQ-9: Recent Review Flowsheet Data    Depression screen [facility] 09/08/2019 08/15/2014   Decreased Interest 0 0   Down, Depressed, Hopeless 0 0   PHQ - 2 Score 0 0     Interpretation of Total Score  Total Score Depression Severity:  1-4 = Minimal depression, 5-9 = Mild depression, 10-14 = Moderate depression, 15-19 = Moderately severe depression, 20-27 = Severe depression   Psychosocial Evaluation and Intervention:   Psychosocial Re-Evaluation:   Psychosocial Discharge (Final Psychosocial Re-Evaluation):   Vocational Rehabilitation: Provide vocational rehab assistance to qualifying candidates.   Vocational Rehab Evaluation & Intervention:  Vocational Rehab - 09/08/19 1216      Initial Vocational Rehab Evaluation & Intervention   Assessment shows need for Vocational Rehabilitation No           Education: Education Goals: Education classes will be provided on a weekly basis, covering required topics. Participant will state understanding/return demonstration of topics presented.  Learning Barriers/Preferences:  Learning Barriers/Preferences - 09/08/19 1127      Learning Barriers/Preferences   Learning Barriers None    Learning Preferences Audio;Individual Instruction;Skilled Demonstration;Written Material           Education Topics: Hypertension, Hypertension Reduction -Define heart disease and high blood pressure. Discus how high blood pressure affects the body and ways to reduce high blood pressure.   Exercise and Your  Heart -Discuss why it is important to exercise, the FITT principles of exercise, normal and abnormal responses to exercise, and how to exercise safely.   Angina -Discuss definition of angina, causes of angina, treatment of angina, and how to decrease risk of having angina.   Cardiac Medications -Review what the following cardiac medications are used for, how they affect the body, and side effects that may occur when taking the medications.  Medications include Aspirin, Beta blockers, calcium channel blockers, ACE Inhibitors, angiotensin receptor blockers, diuretics, digoxin, and antihyperlipidemics.   Congestive Heart Failure -Discuss the definition of CHF, how to live with CHF, the signs and symptoms of CHF, and how keep track of weight and sodium intake.   Heart Disease and Intimacy -Discus the effect sexual activity has on the heart, how changes occur during intimacy as we age, and safety during sexual activity.   Smoking Cessation / COPD -Discuss different methods to quit smoking, the health benefits of quitting smoking, and the definition of COPD.   Nutrition I: Fats -Discuss the types of cholesterol, what cholesterol does  to the heart, and how cholesterol levels can be controlled.   Nutrition II: Labels -Discuss the different components of food labels and how to read food label   Heart Parts/Heart Disease and PAD -Discuss the anatomy of the heart, the pathway of blood circulation through the heart, and these are affected by heart disease.   Stress I: Signs and Symptoms -Discuss the causes of stress, how stress may lead to anxiety and depression, and ways to limit stress.   Stress II: Relaxation -Discuss different types of relaxation techniques to limit stress.   Warning Signs of Stroke / TIA -Discuss definition of a stroke, what the signs and symptoms are of a stroke, and how to identify when someone is having stroke.   Knowledge Questionnaire Score:  Knowledge  Questionnaire Score - 09/08/19 1121      Knowledge Questionnaire Score   Pre Score 22/24           Core Components/Risk Factors/Patient Goals at Admission:  Personal Goals and Risk Factors at Admission - 09/08/19 1127      Core Components/Risk Factors/Patient Goals on Admission    Weight Management Yes;Weight Loss    Intervention Weight Management: Develop a combined nutrition and exercise program designed to reach desired caloric intake, while maintaining appropriate intake of nutrient and fiber, sodium and fats, and appropriate energy expenditure required for the weight goal.;Weight Management: Provide education and appropriate resources to help participant work on and attain dietary goals.;Weight Management/Obesity: Establish reasonable short term and long term weight goals.;Obesity: Provide education and appropriate resources to help participant work on and attain dietary goals.    Admit Weight 197 lb 1.5 oz (89.4 kg)    Goal Weight: Long Term 187 lb (84.8 kg)    Expected Outcomes Short Term: Continue to assess and modify interventions until short term weight is achieved;Long Term: Adherence to nutrition and physical activity/exercise program aimed toward attainment of established weight goal;Weight Loss: Understanding of general recommendations for a balanced deficit meal plan, which promotes 1-2 lb weight loss per week and includes a negative energy balance of [id number] kcal/d;Understanding recommendations for meals to include 15-35% energy as protein, 25-35% energy from fat, 35-60% energy from carbohydrates, less than 200mg  of dietary cholesterol, 20-35 gm of total fiber daily;Understanding of distribution of calorie intake throughout the day with the consumption of 4-5 meals/snacks    Diabetes Yes    Intervention Provide education about signs/symptoms and action to take for hypo/hyperglycemia.;Provide education about proper nutrition, including hydration, and aerobic/resistive exercise  prescription along with prescribed medications to achieve blood glucose in normal ranges: Fasting glucose 65-99 mg/dL    Expected Outcomes Short Term: Participant verbalizes understanding of the signs/symptoms and immediate care of hyper/hypoglycemia, proper foot care and importance of medication, aerobic/resistive exercise and nutrition plan for blood glucose control.;Long Term: Attainment of HbA1C < 7%.    Heart Failure Yes    Intervention Provide a combined exercise and nutrition program that is supplemented with education, support and counseling about heart failure. Directed toward relieving symptoms such as shortness of breath, decreased exercise tolerance, and extremity edema.    Expected Outcomes Improve functional capacity of life;Short term: Attendance in program 2-3 days a week with increased exercise capacity. Reported lower sodium intake. Reported increased fruit and vegetable intake. Reports medication compliance.;Short term: Daily weights obtained and reported for increase. Utilizing diuretic protocols set by physician.;Long term: Adoption of self-care skills and reduction of barriers for early signs and symptoms recognition and intervention leading to self-care maintenance.  Hypertension Yes    Intervention Provide education on lifestyle modifcations including regular physical activity/exercise, weight management, moderate sodium restriction and increased consumption of fresh fruit, vegetables, and low fat dairy, alcohol moderation, and smoking cessation.;Monitor prescription use compliance.    Expected Outcomes Short Term: Continued assessment and intervention until BP is < 140/42mm HG in hypertensive participants. < 130/28mm HG in hypertensive participants with diabetes, heart failure or chronic kidney disease.;Long Term: Maintenance of blood pressure at goal levels.    Lipids Yes    Intervention Provide education and support for participant on nutrition & aerobic/resistive exercise along  with prescribed medications to achieve LDL 70mg , HDL >40mg .    Expected Outcomes Long Term: Cholesterol controlled with medications as prescribed, with individualized exercise RX and with personalized nutrition plan. Value goals: LDL < 70mg , HDL > 40 mg.;Short Term: Participant states understanding of desired cholesterol values and is compliant with medications prescribed. Participant is following exercise prescription and nutrition guidelines.           Core Components/Risk Factors/Patient Goals Review:    Core Components/Risk Factors/Patient Goals at Discharge (Final Review):    ITP Comments:  ITP Comments    Row Name 09/08/19 1115           ITP Comments Dr. 09/10/19, Medical Director              Comments: Armanda Magic  attended orientation on 09/08/2019 to review rules and guidelines for program.  Completed 6 minute walk test, Intitial ITP, and exercise prescription.  VSS. Telemetry-Sinus Rhythm.  Asymptomatic. Safety measures and social distancing in place per CDC guidelines.09/10/2019, RN,BSN 09/08/2019 12:21 PM

## 2019-09-12 ENCOUNTER — Encounter (HOSPITAL_COMMUNITY)
Admission: RE | Admit: 2019-09-12 | Discharge: 2019-09-12 | Disposition: A | Discharge status: Home / Self Care | Payer: BC Managed Care – PPO | Source: Ambulatory Visit | Attending: Cardiology | Admitting: Cardiology

## 2019-09-12 ENCOUNTER — Other Ambulatory Visit: Payer: Self-pay

## 2019-09-12 DIAGNOSIS — I5022 Chronic systolic (congestive) heart failure: Secondary | ICD-10-CM

## 2019-09-12 LAB — GLUCOSE, CAPILLARY
Glucose-Capillary: 130 mg/dL — ABNORMAL HIGH (ref 70–99)
Glucose-Capillary: 111 mg/dL — ABNORMAL HIGH (ref 70–99)

## 2019-09-12 NOTE — Progress Notes (Signed)
Daily Session Note  Patient Details  Name: Anna Barber MRN: [id number] Date of Birth: 1955-11-12 Referring Provider:     [facility] from 09/08/2019 in [place]  Referring Provider Nigel Mormon      Encounter Date: 09/12/2019  Check In:  Session Check In - 09/12/19 1100      Check-In   Supervising physician immediately available to respond to emergencies [facility] immediately available    Physician(s) Dr. Rodena Piety    Location [facility]    Staff Present Barnet Pall, RN, Luisa Hart, RN, Deland Pretty, MS, ACSM CEP, Exercise Physiologist;David Lilyan Punt, MS, EP-C, CCRP;Jessica Hassell Done, MS, ACSM-CEP, Exercise Physiologist    Virtual Visit No    Medication changes reported     No    Fall or balance concerns reported    No    Tobacco Cessation No Change    Warm-up and Cool-down Performed as group-led instruction    Resistance Training Performed Yes    VAD Patient? No    PAD/SET Patient? No      Pain Assessment   Currently in Pain? No/denies    Multiple Pain Sites No           Capillary Blood Glucose: Results for orders placed or performed during the hospital encounter of 09/12/19 (from the past 24 hour(s))  Glucose, capillary     Status: Abnormal   Collection Time: 09/12/19 10:49 AM  Result Value Ref Range   Glucose-Capillary 130 (H) 70 - 99 mg/dL  Glucose, capillary     Status: Abnormal   Collection Time: 09/12/19 11:54 AM  Result Value Ref Range   Glucose-Capillary 111 (H) 70 - 99 mg/dL     Exercise Prescription Changes - 09/12/19 1102      Response to Exercise   Blood Pressure (Admit) 110/60    Blood Pressure (Exercise) 128/72    Blood Pressure (Exit) 118/60    Heart Rate (Admit) 78 bpm    Heart Rate (Exercise) 95 bpm    Heart Rate (Exit) 66 bpm    Rating of Perceived Exertion (Exercise) 11    Symptoms none    Comments Off to a good start with exercise.      Duration Continue with 30 min of aerobic exercise without signs/symptoms of physical distress.    Intensity THRR unchanged      Progression   Progression Continue to progress workloads to maintain intensity without signs/symptoms of physical distress.    Average METs 2      Resistance Training   Training Prescription Yes    Weight 2lbs    Reps 10-15    Time 10 Minutes      Interval Training   Interval Training No      Treadmill   MPH 1.7    Grade 0    Minutes 15    METs 2.3      NuStep   Level 2    SPM 75    Minutes 15    METs 1.6           Social History   Tobacco Use  Smoking Status Former Smoker  . Packs/day: 1.50  . Years: 15.00  . Pack years: 22.50  . Types: Cigarettes  . Quit date: 03/04/1987  . Years since quitting: 32.5  Smokeless Tobacco Never Used    Goals Met:  Exercise tolerated well No report of cardiac concerns or symptoms Strength training completed today  Goals Unmet:  Not Applicable  Comments: Pt started cardiac rehab today.  Pt tolerated light exercise without difficulty. VSS, telemetry-Sinus Rhythm , asymptomatic.  Medication list reconciled. Pt denies barriers to medicaiton compliance.  PSYCHOSOCIAL ASSESSMENT:  PHQ-0. Pt exhibits positive coping skills, hopeful outlook with supportive family. No psychosocial needs identified at this time, no psychosocial interventions necessary.    Pt enjoys needle working, watching Tennis and water walking.   Pt oriented to exercise equipment and routine.    Understanding verbalized.Barnet Pall, RN,BSN 09/12/2019 4:38 PM   Dr. Fransico Him is Medical Director for Cardiac Rehab at [facility].

## 2019-09-13 NOTE — Progress Notes (Signed)
Cardiac Individual Treatment Plan  Patient Details  Name: Anna Barber MRN: [id number] Date of Birth: 1956/02/18 Referring Provider:     [place] from 09/08/2019 in [facility]  Referring Provider [place], [place] J      Initial Encounter Date:    CARDIAC REHAB PHASE II ORIENTATION from 09/08/2019 in [facility]  Date 09/08/19      Visit Diagnosis: Heart failure, chronic systolic ([place])  Patient's Home Medications on Admission:  Current Outpatient Medications:  .  carvedilol (COREG) 3.125 MG tablet, TAKE ONE TABLET TWICE DAILY, Disp: 60 tablet, Rfl: 1 .  clopidogrel (PLAVIX) 75 MG tablet, TAKE ONE TABLET EACH DAY, Disp: 30 tablet, Rfl: 0 .  CORLANOR 5 MG TABS tablet, TAKE ONE TABLET TWICE DAILY WITH A MEAL, Disp: 60 tablet, Rfl: 3 .  desonide (DESOWEN) 0.05 % cream, APPLY TWICE DAILY FOR SEBORRHEIC DERMATITIS ON FACE FOR 1 TO 2 WEEKS, Disp: , Rfl:  .  ENTRESTO 97-103 MG, TAKE ONE TABLET TWICE DAILY, Disp: 60 tablet, Rfl: 0 .  FARXIGA 10 MG TABS tablet, TAKE ONE TABLET DAILY, Disp: 30 tablet, Rfl: 3 .  metFORMIN (GLUCOPHAGE) 500 MG tablet, Take by mouth as directed. 2 tablets in the morning, 2 tablets in the evening, Disp: , Rfl:  .  nitroGLYCERIN (NITROSTAT) 0.4 MG SL tablet, Place 1 tablet (0.4 mg total) under the tongue every 5 (five) minutes as needed for chest pain., Disp: 30 tablet, Rfl: 2 .  pantoprazole (PROTONIX) 40 MG tablet, TAKE ONE TABLET EACH DAY, Disp: 30 tablet, Rfl: 0 .  rosuvastatin (CRESTOR) 20 MG tablet, TAKE ONE TABLET EACH DAY, Disp: 90 tablet, Rfl: 1 .  spironolactone (ALDACTONE) 25 MG tablet, TAKE ONE TABLET DAILY, Disp: 30 tablet, Rfl: 3  Past Medical History: Past Medical History:  Diagnosis Date  . Diabetes mellitus without complication ([place])   . Hyperlipidemia   . Hypertension     Tobacco Use: Social History   Tobacco Use  Smoking Status Former Smoker  .  Packs/day: 1.50  . Years: 15.00  . Pack years: 22.50  . Types: Cigarettes  . Quit date: 03/04/1987  . Years since quitting: 32.5  Smokeless Tobacco Never Used    Labs: Recent Review Scientist, physiological    Labs for ITP Cardiac and Pulmonary Rehab Latest Ref Rng & Units 03/07/2019   Cholestrol 100 - 199 mg/dL 90(L)   LDLCALC 0 - 99 mg/dL 37   HDL >39 mg/dL 30(L)   Trlycerides 0 - 149 mg/dL 131      Capillary Blood Glucose: Lab Results  Component Value Date   GLUCAP 136 (H) 09/14/2019   GLUCAP 128 (H) 09/14/2019   GLUCAP 111 (H) 09/12/2019   GLUCAP 130 (H) 09/12/2019   GLUCAP 120 (H) 09/08/2019     Exercise Target Goals: Exercise Program Goal: Individual exercise prescription set using results from initial 6 min walk test and THRR while considering  patient's activity barriers and safety.   Exercise Prescription Goal: Starting with aerobic activity 30 plus minutes a day, 3 days per week for initial exercise prescription. Provide home exercise prescription and guidelines that participant acknowledges understanding prior to discharge.  Activity Barriers & Risk Stratification:  Activity Barriers & Cardiac Risk Stratification - 09/08/19 1116      Activity Barriers & Cardiac Risk Stratification   Activity Barriers Other (comment)    Comments Occasional Hip Pain    Cardiac Risk Stratification High  6 Minute Walk:  6 Minute Walk    Row Name 09/08/19 1115         6 Minute Walk   Phase Initial     Distance 1363 feet     Walk Time 6 minutes     # of Rest Breaks 0     MPH 2.6     METS 2.5     RPE 11     Perceived Dyspnea  0     VO2 Peak 8.8     Symptoms No     Resting HR 68 bpm     Resting BP 116/80     Resting Oxygen Saturation  98 %     Exercise Oxygen Saturation  during 6 min walk 97 %     Max Ex. HR 105 bpm     Max Ex. BP 139/78     2 Minute Post BP 124/78            Oxygen Initial Assessment:   Oxygen Re-Evaluation:   Oxygen Discharge  (Final Oxygen Re-Evaluation):   Initial Exercise Prescription:  Initial Exercise Prescription - 09/08/19 1100      Date of Initial Exercise RX and Referring Provider   Date 09/08/19    Referring Provider Truett [place] J    Expected Discharge Date 11/04/19      Treadmill   MPH 1.7    Grade 0    Minutes 15    METs 2.3      NuStep   Level 2    SPM 75    Minutes 15    METs 2.3      Prescription Details   Frequency (times per week) 2lbs    Duration Progress to 10 minutes continuous walking  at current work load and total walking time to 30-45 min      Intensity   THRR 40-80% of Max Heartrate 63-126    Ratings of Perceived Exertion 11-13    Perceived Dyspnea 0-4      Progression   Progression Continue progressive overload as per policy without signs/symptoms or physical distress.      Resistance Training   Training Prescription Yes    Weight 2lbs    Reps 10-15           Perform Capillary Blood Glucose checks as needed.  Exercise Prescription Changes:   Exercise Prescription Changes    Row Name 09/12/19 1102             Response to Exercise   Blood Pressure (Admit) 110/60       Blood Pressure (Exercise) 128/72       Blood Pressure (Exit) 118/60       Heart Rate (Admit) 78 bpm       Heart Rate (Exercise) 95 bpm       Heart Rate (Exit) 66 bpm       Rating of Perceived Exertion (Exercise) 11       Symptoms none       Comments Off to a good start with exercise.        Duration Continue with 30 min of aerobic exercise without signs/symptoms of physical distress.       Intensity THRR unchanged         Progression   Progression Continue to progress workloads to maintain intensity without signs/symptoms of physical distress.       Average METs 2         Resistance Training   Training Prescription  Yes       Weight 2lbs       Reps 10-15       Time 10 Minutes         Interval Training   Interval Training No         Treadmill   MPH 1.7       Grade  0       Minutes 15       METs 2.3         NuStep   Level 2       SPM 75       Minutes 15       METs 1.6              Exercise Comments:   Exercise Comments    Row Name 09/12/19 1158           Exercise Comments Patient tolerated first session of exercise well without symptoms.              Exercise Goals and Review:   Exercise Goals    Row Name 09/08/19 1117             Exercise Goals   Increase Physical Activity Yes       Intervention Provide advice, education, support and counseling about physical activity/exercise needs.;Develop an individualized exercise prescription for aerobic and resistive training based on initial evaluation findings, risk stratification, comorbidities and participant's personal goals.       Expected Outcomes Short Term: Attend rehab on a regular basis to increase amount of physical activity.;Long Term: Add in home exercise to make exercise part of routine and to increase amount of physical activity.;Long Term: Exercising regularly at least 3-5 days a week.       Increase Strength and Stamina Yes       Intervention Provide advice, education, support and counseling about physical activity/exercise needs.;Develop an individualized exercise prescription for aerobic and resistive training based on initial evaluation findings, risk stratification, comorbidities and participant's personal goals.       Expected Outcomes Short Term: Increase workloads from initial exercise prescription for resistance, speed, and METs.;Short Term: Perform resistance training exercises routinely during rehab and add in resistance training at home;Long Term: Improve cardiorespiratory fitness, muscular endurance and strength as measured by increased METs and functional capacity (6MWT)       Able to understand and use rate of perceived exertion (RPE) scale Yes       Intervention Provide education and explanation on how to use RPE scale       Expected Outcomes Short Term: Able  to use RPE daily in rehab to express subjective intensity level;Long Term:  Able to use RPE to guide intensity level when exercising independently       Knowledge and understanding of Target Heart Rate Range (THRR) Yes       Intervention Provide education and explanation of THRR including how the numbers were predicted and where they are located for reference       Expected Outcomes Short Term: Able to state/look up THRR;Long Term: Able to use THRR to govern intensity when exercising independently;Short Term: Able to use daily as guideline for intensity in rehab       Able to check pulse independently Yes       Intervention Provide education and demonstration on how to check pulse in carotid and radial arteries.;Review the importance of being able to check your own pulse for safety during independent exercise  Expected Outcomes Short Term: Able to explain why pulse checking is important during independent exercise;Long Term: Able to check pulse independently and accurately       Understanding of Exercise Prescription Yes       Intervention Provide education, explanation, and written materials on patient's individual exercise prescription       Expected Outcomes Short Term: Able to explain program exercise prescription;Long Term: Able to explain home exercise prescription to exercise independently              Exercise Goals Re-Evaluation :  Exercise Goals Re-Evaluation    Row Name 09/12/19 1625             Exercise Goal Re-Evaluation   Exercise Goals Review Increase Physical Activity;Able to understand and use rate of perceived exertion (RPE) scale       Comments Patient able to understand and use RPE scale appropriately       Expected Outcomes Increase workloads as tolerated to help increase cardiorespiratory fitness.               Discharge Exercise Prescription (Final Exercise Prescription Changes):  Exercise Prescription Changes - 09/12/19 1102      Response to Exercise     Blood Pressure (Admit) 110/60    Blood Pressure (Exercise) 128/72    Blood Pressure (Exit) 118/60    Heart Rate (Admit) 78 bpm    Heart Rate (Exercise) 95 bpm    Heart Rate (Exit) 66 bpm    Rating of Perceived Exertion (Exercise) 11    Symptoms none    Comments Off to a good start with exercise.     Duration Continue with 30 min of aerobic exercise without signs/symptoms of physical distress.    Intensity THRR unchanged      Progression   Progression Continue to progress workloads to maintain intensity without signs/symptoms of physical distress.    Average METs 2      Resistance Training   Training Prescription Yes    Weight 2lbs    Reps 10-15    Time 10 Minutes      Interval Training   Interval Training No      Treadmill   MPH 1.7    Grade 0    Minutes 15    METs 2.3      NuStep   Level 2    SPM 75    Minutes 15    METs 1.6           Nutrition:  Target Goals: Understanding of nutrition guidelines, daily intake of sodium 1500mg , cholesterol 200mg , calories 30% from fat and 7% or less from saturated fats, daily to have 5 or more servings of fruits and vegetables.  Biometrics:  Pre Biometrics - 09/08/19 1118      Pre Biometrics   Height  (1.626 m)    Weight 89.4 kg    Waist Circumference 42 inches    Hip Circumference 47 inches    Waist to Hip Ratio 0.89 %    BMI (Calculated) 33.81    Triceps Skinfold 30 mm    % Body Fat 44.7 %    Grip Strength 24 kg    Flexibility 17 in    Single Leg Stand 19.7 seconds            Nutrition Therapy Plan and Nutrition Goals:  Nutrition Therapy & Goals - 09/15/19 0825      Nutrition Therapy   Diet Heart Healthy/Carb Modified  Drug/Food Interactions Statins/Certain Fruits      Personal Nutrition Goals   Nutrition Goal Pt to limit carbs to <60 g per meal    Personal Goal #2 Pt to identify food quantities necessary to achieve weight loss of 6-24 lb at graduation from cardiac rehab.    Personal Goal #3  Pt to build a healthy plate including vegetables, fruits, whole grains, and low-fat dairy products in a heart healthy meal plan.      Intervention Plan   Intervention Prescribe, educate and counsel regarding individualized specific dietary modifications aiming towards targeted core components such as weight, hypertension, lipid management, diabetes, heart failure and other comorbidities.;Nutrition handout(s) given to patient.    Expected Outcomes Long Term Goal: Adherence to prescribed nutrition plan.;Short Term Goal: Understand basic principles of dietary content, such as calories, fat, sodium, cholesterol and nutrients.           Nutrition Assessments:  Nutrition Assessments - 09/15/19 0827      MEDFICTS Scores   Pre Score 39           Nutrition Goals Re-Evaluation:  Nutrition Goals Re-Evaluation    Row Name 09/15/19 0827             Goals   Current Weight 197 lb (89.4 kg)       Nutrition Goal Pt to limit carbs to <60 g per meal         Personal Goal #2 Re-Evaluation   Personal Goal #2 Pt to identify food quantities necessary to achieve weight loss of 6-24 lb at graduation from cardiac rehab.         Personal Goal #3 Re-Evaluation   Personal Goal #3 Pt to build a healthy plate including vegetables, fruits, whole grains, and low-fat dairy products in a heart healthy meal plan.              Nutrition Goals Discharge (Final Nutrition Goals Re-Evaluation):  Nutrition Goals Re-Evaluation - 09/15/19 0827      Goals   Current Weight 197 lb (89.4 kg)    Nutrition Goal Pt to limit carbs to <60 g per meal      Personal Goal #2 Re-Evaluation   Personal Goal #2 Pt to identify food quantities necessary to achieve weight loss of 6-24 lb at graduation from cardiac rehab.      Personal Goal #3 Re-Evaluation   Personal Goal #3 Pt to build a healthy plate including vegetables, fruits, whole grains, and low-fat dairy products in a heart healthy meal plan.            Psychosocial: Target Goals: Acknowledge presence or absence of significant depression and/or stress, maximize coping skills, provide positive support system. Participant is able to verbalize types and ability to use techniques and skills needed for reducing stress and depression.  Initial Review & Psychosocial Screening:  Initial Psych Review & Screening - 09/08/19 1214      Initial Review   Current issues with None Identified      Family Dynamics   Good Support System? Yes   Sylva lives alone but has friends and family who live nearby for support     Barriers   Psychosocial barriers to participate in program There are no identifiable barriers or psychosocial needs.      Screening Interventions   Interventions Encouraged to exercise           Quality of Life Scores:  Quality of Life - 09/08/19 1121      Quality  of Life   Select Quality of Life      Quality of Life Scores   Health/Function Pre 26.57 %    Socioeconomic Pre 30 %    Psych/Spiritual Pre 27.86 %    Family Pre 28.5 %    GLOBAL Pre 27.77 %          Scores of 19 and below usually indicate a poorer quality of life in these areas.  A difference of  2-3 points is a clinically meaningful difference.  A difference of 2-3 points in the total score of the Quality of Life Index has been associated with significant improvement in overall quality of life, self-image, physical symptoms, and general health in studies assessing change in quality of life.  PHQ-9: Recent Review Flowsheet Data    Depression screen [facility] 2/9 09/08/2019 08/15/2014   Decreased Interest 0 0   Down, Depressed, Hopeless 0 0   PHQ - 2 Score 0 0     Interpretation of Total Score  Total Score Depression Severity:  1-4 = Minimal depression, 5-9 = Mild depression, 10-14 = Moderate depression, 15-19 = Moderately severe depression, 20-27 = Severe depression   Psychosocial Evaluation and Intervention:   Psychosocial  Re-Evaluation:   Psychosocial Discharge (Final Psychosocial Re-Evaluation):   Vocational Rehabilitation: Provide vocational rehab assistance to qualifying candidates.   Vocational Rehab Evaluation & Intervention:  Vocational Rehab - 09/08/19 1216      Initial Vocational Rehab Evaluation & Intervention   Assessment shows need for Vocational Rehabilitation No           Education: Education Goals: Education classes will be provided on a weekly basis, covering required topics. Participant will state understanding/return demonstration of topics presented.  Learning Barriers/Preferences:  Learning Barriers/Preferences - 09/08/19 1127      Learning Barriers/Preferences   Learning Barriers None    Learning Preferences Audio;Individual Instruction;Skilled Demonstration;Written Material           Education Topics: Hypertension, Hypertension Reduction -Define heart disease and high blood pressure. Discus how high blood pressure affects the body and ways to reduce high blood pressure.   Exercise and Your Heart -Discuss why it is important to exercise, the FITT principles of exercise, normal and abnormal responses to exercise, and how to exercise safely.   Angina -Discuss definition of angina, causes of angina, treatment of angina, and how to decrease risk of having angina.   Cardiac Medications -Review what the following cardiac medications are used for, how they affect the body, and side effects that may occur when taking the medications.  Medications include Aspirin, Beta blockers, calcium channel blockers, ACE Inhibitors, angiotensin receptor blockers, diuretics, digoxin, and antihyperlipidemics.   Congestive Heart Failure -Discuss the definition of CHF, how to live with CHF, the signs and symptoms of CHF, and how keep track of weight and sodium intake.   Heart Disease and Intimacy -Discus the effect sexual activity has on the heart, how changes occur during intimacy as  we age, and safety during sexual activity.   Smoking Cessation / COPD -Discuss different methods to quit smoking, the health benefits of quitting smoking, and the definition of COPD.   Nutrition I: Fats -Discuss the types of cholesterol, what cholesterol does to the heart, and how cholesterol levels can be controlled.   Nutrition II: Labels -Discuss the different components of food labels and how to read food label   Heart Parts/Heart Disease and PAD -Discuss the anatomy of the heart, the pathway of blood circulation through the  heart, and these are affected by heart disease.   Stress I: Signs and Symptoms -Discuss the causes of stress, how stress may lead to anxiety and depression, and ways to limit stress.   Stress II: Relaxation -Discuss different types of relaxation techniques to limit stress.   Warning Signs of Stroke / TIA -Discuss definition of a stroke, what the signs and symptoms are of a stroke, and how to identify when someone is having stroke.   Knowledge Questionnaire Score:  Knowledge Questionnaire Score - 09/08/19 1121      Knowledge Questionnaire Score   Pre Score 22/24           Core Components/Risk Factors/Patient Goals at Admission:  Personal Goals and Risk Factors at Admission - 09/08/19 1127      Core Components/Risk Factors/Patient Goals on Admission    Weight Management Yes;Weight Loss    Intervention Weight Management: Develop a combined nutrition and exercise program designed to reach desired caloric intake, while maintaining appropriate intake of nutrient and fiber, sodium and fats, and appropriate energy expenditure required for the weight goal.;Weight Management: Provide education and appropriate resources to help participant work on and attain dietary goals.;Weight Management/Obesity: Establish reasonable short term and long term weight goals.;Obesity: Provide education and appropriate resources to help participant work on and attain dietary  goals.    Admit Weight 197 lb 1.5 oz (89.4 kg)    Goal Weight: Long Term 187 lb (84.8 kg)    Expected Outcomes Short Term: Continue to assess and modify interventions until short term weight is achieved;Long Term: Adherence to nutrition and physical activity/exercise program aimed toward attainment of established weight goal;Weight Loss: Understanding of general recommendations for a balanced deficit meal plan, which promotes 1-2 lb weight loss per week and includes a negative energy balance of [phone] kcal/d;Understanding recommendations for meals to include 15-35% energy as protein, 25-35% energy from fat, 35-60% energy from carbohydrates, less than 200mg  of dietary cholesterol, 20-35 gm of total fiber daily;Understanding of distribution of calorie intake throughout the day with the consumption of 4-5 meals/snacks    Diabetes Yes    Intervention Provide education about signs/symptoms and action to take for hypo/hyperglycemia.;Provide education about proper nutrition, including hydration, and aerobic/resistive exercise prescription along with prescribed medications to achieve blood glucose in normal ranges: Fasting glucose 65-99 mg/dL    Expected Outcomes Short Term: Participant verbalizes understanding of the signs/symptoms and immediate care of hyper/hypoglycemia, proper foot care and importance of medication, aerobic/resistive exercise and nutrition plan for blood glucose control.;Long Term: Attainment of HbA1C < 7%.    Heart Failure Yes    Intervention Provide a combined exercise and nutrition program that is supplemented with education, support and counseling about heart failure. Directed toward relieving symptoms such as shortness of breath, decreased exercise tolerance, and extremity edema.    Expected Outcomes Improve functional capacity of life;Short term: Attendance in program 2-3 days a week with increased exercise capacity. Reported lower sodium intake. Reported increased fruit and vegetable  intake. Reports medication compliance.;Short term: Daily weights obtained and reported for increase. Utilizing diuretic protocols set by physician.;Long term: Adoption of self-care skills and reduction of barriers for early signs and symptoms recognition and intervention leading to self-care maintenance.    Hypertension Yes    Intervention Provide education on lifestyle modifcations including regular physical activity/exercise, weight management, moderate sodium restriction and increased consumption of fresh fruit, vegetables, and low fat dairy, alcohol moderation, and smoking cessation.;Monitor prescription use compliance.    Expected Outcomes Short  Term: Continued assessment and intervention until BP is < 140/59mm HG in hypertensive participants. < 130/8mm HG in hypertensive participants with diabetes, heart failure or chronic kidney disease.;Long Term: Maintenance of blood pressure at goal levels.    Lipids Yes    Intervention Provide education and support for participant on nutrition & aerobic/resistive exercise along with prescribed medications to achieve LDL 70mg , HDL >40mg .    Expected Outcomes Long Term: Cholesterol controlled with medications as prescribed, with individualized exercise RX and with personalized nutrition plan. Value goals: LDL < , HDL > 40 mg.;Short Term: Participant states understanding of desired cholesterol values and is compliant with medications prescribed. Participant is following exercise prescription and nutrition guidelines.           Core Components/Risk Factors/Patient Goals Review:   Goals and Risk Factor Review    Row Name 09/13/19 1012             Core Components/Risk Factors/Patient Goals Review   Personal Goals Review Weight Management/Obesity;Lipids;Diabetes;Hypertension       Review Anna Barber started her first day of exercise on 09/13/19 without difficulty       Expected Outcomes Anna Barber will continue to partcipate in phase 2 cardiac rehab for  exercise, nutrition and lifestyle modifications.              Core Components/Risk Factors/Patient Goals at Discharge (Final Review):   Goals and Risk Factor Review - 09/13/19 1012      Core Components/Risk Factors/Patient Goals Review   Personal Goals Review Weight Management/Obesity;Lipids;Diabetes;Hypertension    Review Anna Barber started her first day of exercise on 09/13/19 without difficulty    Expected Outcomes Anna Barber will continue to partcipate in phase 2 cardiac rehab for exercise, nutrition and lifestyle modifications.           ITP Comments:  ITP Comments    Row Name 09/08/19 1115 09/12/19 1106         ITP Comments Dr. Armanda Magic, Medical Director 30 Day ITP Review. Vallarie started exercise on 09/12/19 and exercised without difficulty.             Comments: See ITP comments.[place] Lighter, RN,BSN 09/15/2019 3:04 PM

## 2019-09-14 ENCOUNTER — Other Ambulatory Visit: Payer: Self-pay

## 2019-09-14 ENCOUNTER — Encounter (HOSPITAL_COMMUNITY)
Admission: RE | Admit: 2019-09-14 | Discharge: 2019-09-14 | Disposition: A | Discharge status: Home / Self Care | Payer: BC Managed Care – PPO | Source: Ambulatory Visit | Attending: Cardiology | Admitting: Cardiology

## 2019-09-14 VITALS — Wt 197.0 lb

## 2019-09-14 DIAGNOSIS — I5022 Chronic systolic (congestive) heart failure: Secondary | ICD-10-CM

## 2019-09-14 LAB — GLUCOSE, CAPILLARY
Glucose-Capillary: 128 mg/dL — ABNORMAL HIGH (ref 70–99)
Glucose-Capillary: 136 mg/dL — ABNORMAL HIGH (ref 70–99)

## 2019-09-15 NOTE — Progress Notes (Signed)
Anna Barber 64 y.o. female Nutrition Note  Visit Diagnosis: Heart failure, chronic systolic (HCC)  Past Medical History:  Diagnosis Date  . Diabetes mellitus without complication (HCC)   . Hyperlipidemia   . Hypertension      Medications reviewed.   Current Outpatient Medications:  .  carvedilol (COREG) 3.125 MG tablet, TAKE ONE TABLET TWICE DAILY, Disp: 60 tablet, Rfl: 1 .  clopidogrel (PLAVIX) 75 MG tablet, TAKE ONE TABLET EACH DAY, Disp: 30 tablet, Rfl: 0 .  CORLANOR 5 MG TABS tablet, TAKE ONE TABLET TWICE DAILY WITH A MEAL, Disp: 60 tablet, Rfl: 3 .  desonide (DESOWEN) 0.05 % cream, APPLY TWICE DAILY FOR SEBORRHEIC DERMATITIS ON FACE FOR 1 TO 2 WEEKS, Disp: , Rfl:  .  ENTRESTO 97-103 MG, TAKE ONE TABLET TWICE DAILY, Disp: 60 tablet, Rfl: 0 .  FARXIGA 10 MG TABS tablet, TAKE ONE TABLET DAILY, Disp: 30 tablet, Rfl: 3 .  metFORMIN (GLUCOPHAGE) 500 MG tablet, Take by mouth as directed. 2 tablets in the morning, 2 tablets in the evening, Disp: , Rfl:  .  nitroGLYCERIN (NITROSTAT) 0.4 MG SL tablet, Place 1 tablet (0.4 mg total) under the tongue every 5 (five) minutes as needed for chest pain., Disp: 30 tablet, Rfl: 2 .  pantoprazole (PROTONIX) 40 MG tablet, TAKE ONE TABLET EACH DAY, Disp: 30 tablet, Rfl: 0 .  rosuvastatin (CRESTOR) 20 MG tablet, TAKE ONE TABLET EACH DAY, Disp: 90 tablet, Rfl: 1 .  spironolactone (ALDACTONE) 25 MG tablet, TAKE ONE TABLET DAILY, Disp: 30 tablet, Rfl: 3   Ht Readings from Last 1 Encounters:  09/08/19 5\' 4"  (1.626 m)     Wt Readings from Last 3 Encounters:  09/08/19 197 lb 1.5 oz (89.4 kg)  08/03/19 188 lb (85.3 kg)  05/30/19 186 lb 6.4 oz (84.6 kg)     There is no height or weight on file to calculate BMI.   Social History   Tobacco Use  Smoking Status Former Smoker  . Packs/day: 1.50  . Years: 15.00  . Pack years: 22.50  . Types: Cigarettes  . Quit date: 03/04/1987  . Years since quitting: 32.5  Smokeless Tobacco Never Used      Lab Results  Component Value Date   CHOL 90 (L) 03/07/2019   Lab Results  Component Value Date   HDL 30 (L) 03/07/2019   Lab Results  Component Value Date   LDLCALC 37 03/07/2019   Lab Results  Component Value Date   TRIG 131 03/07/2019     No results found for: HGBA1C   CBG (last 3)  Recent Labs    09/12/19 1154 09/14/19 1049 09/14/19 1142  GLUCAP 111* 128* 136*     Nutrition Note  Spoke with pt. Nutrition Plan and Nutrition Survey goals reviewed with pt. Pt is following a Heart Healthy diet. She lost 30 lbs when she was sick/had MI. She still wants to lose wt. Reviewed weight/diet history. She has dieted her whole life and weight cycled as well. She says she hates exercise. She feels motivated to make changes for her health.   Pt has Type 2 Diabetes. Last A1c indicates blood glucose well-controlled. Pt checks CBG's 1 times a day. Fasting CBG's reportedly 107-113 mg/dL. She wants DM education to learn to limit carbs at meals. She feels comfortable reading labels. She has done this for years. She limits sodium as well. Pt with dx of CHF. Per discussion, pt does not use canned/convenience foods often. Pt does not  add salt to food. Pt does eat out frequently but tries to choose lower sodium options. She typically chooses salads and does not add dressing.   Pt expressed understanding of the information reviewed.    Nutrition Diagnosis ? Obese  I = 30-34.9 related to excessive energy intake as evidenced by a 33.81 kg/m2  Nutrition Intervention ? Pt's individual nutrition plan reviewed with pt. ? Benefits of adopting Heart Healthy diet discussed when Med[employer] reviewed.   ? Continue client-centered nutrition education by RD, as part of interdisciplinary care.  Goal(s) ? Pt to limit carbs to <60 g per meal  ? Pt to identify food quantities necessary to achieve weight loss of 6-24 lb at graduation from cardiac rehab.  ? Pt to build a healthy plate including vegetables,  fruits, whole grains, and low-fat dairy products in a heart healthy meal plan.  Plan:   Will provide client-centered nutrition education as part of interdisciplinary care  Monitor and evaluate progress toward nutrition goal with team.   Michaele Offer, MS, RDN, LDN

## 2019-09-16 ENCOUNTER — Encounter (HOSPITAL_COMMUNITY)
Admission: RE | Admit: 2019-09-16 | Discharge: 2019-09-16 | Disposition: A | Discharge status: Home / Self Care | Payer: BC Managed Care – PPO | Source: Ambulatory Visit | Attending: Cardiology | Admitting: Cardiology

## 2019-09-16 ENCOUNTER — Other Ambulatory Visit: Payer: Self-pay

## 2019-09-16 DIAGNOSIS — I5022 Chronic systolic (congestive) heart failure: Secondary | ICD-10-CM

## 2019-09-19 ENCOUNTER — Encounter (HOSPITAL_COMMUNITY)
Admission: RE | Admit: 2019-09-19 | Discharge: 2019-09-19 | Disposition: A | Discharge status: Home / Self Care | Payer: BC Managed Care – PPO | Source: Ambulatory Visit | Attending: Cardiology | Admitting: Cardiology

## 2019-09-19 ENCOUNTER — Other Ambulatory Visit: Payer: Self-pay

## 2019-09-19 DIAGNOSIS — I5022 Chronic systolic (congestive) heart failure: Secondary | ICD-10-CM | POA: Diagnosis not present

## 2019-09-19 NOTE — Progress Notes (Signed)
Nutrition Note  Spoke with pt today about carbohydrate counting, and eating a consistent amount of carbohydrates across the day. Reviewed the benefits of carbohydrate counting and eating a consistent amount of carbohydrates across the day. Showed pt how to calculate carbohydrate servings, and distributed handouts for patient to practice. Recommended pt eat 3-4 servings of carbohydrates at meals. Discussed the importance of creating a balanced meal with the addition of protein and non-starchy vegetables. Distributed recipes and snack ideas to patient to try. Distributed handout of snack ideas that would provide a combination of carbohydrates and protein. Pt verbalized understanding of material discussed today. Distributed RD contact information.     Andrey Campanile, MS, RDN, LDN

## 2019-09-21 ENCOUNTER — Encounter (HOSPITAL_COMMUNITY)
Admission: RE | Admit: 2019-09-21 | Discharge: 2019-09-21 | Disposition: A | Discharge status: Home / Self Care | Payer: BC Managed Care – PPO | Source: Ambulatory Visit | Attending: Cardiology | Admitting: Cardiology

## 2019-09-21 ENCOUNTER — Other Ambulatory Visit: Payer: Self-pay

## 2019-09-21 DIAGNOSIS — I5022 Chronic systolic (congestive) heart failure: Secondary | ICD-10-CM

## 2019-09-21 LAB — GLUCOSE, CAPILLARY: Glucose-Capillary: 174 mg/dL — ABNORMAL HIGH (ref 70–99)

## 2019-09-23 ENCOUNTER — Other Ambulatory Visit: Payer: Self-pay

## 2019-09-23 ENCOUNTER — Encounter (HOSPITAL_COMMUNITY)
Admission: RE | Admit: 2019-09-23 | Discharge: 2019-09-23 | Disposition: A | Discharge status: Home / Self Care | Payer: BC Managed Care – PPO | Source: Ambulatory Visit | Attending: Cardiology | Admitting: Cardiology

## 2019-09-23 DIAGNOSIS — I5022 Chronic systolic (congestive) heart failure: Secondary | ICD-10-CM | POA: Insufficient documentation

## 2019-09-28 ENCOUNTER — Other Ambulatory Visit: Payer: Self-pay

## 2019-09-28 ENCOUNTER — Encounter (HOSPITAL_COMMUNITY)
Admission: RE | Admit: 2019-09-28 | Discharge: 2019-09-28 | Disposition: A | Discharge status: Home / Self Care | Payer: BC Managed Care – PPO | Source: Ambulatory Visit | Attending: Cardiology | Admitting: Cardiology

## 2019-09-28 DIAGNOSIS — I5022 Chronic systolic (congestive) heart failure: Secondary | ICD-10-CM

## 2019-09-30 ENCOUNTER — Encounter (HOSPITAL_COMMUNITY)
Admission: RE | Admit: 2019-09-30 | Discharge: 2019-09-30 | Disposition: A | Discharge status: Home / Self Care | Payer: BC Managed Care – PPO | Source: Ambulatory Visit | Attending: Cardiology | Admitting: Cardiology

## 2019-09-30 ENCOUNTER — Other Ambulatory Visit: Payer: Self-pay | Admitting: Cardiology

## 2019-09-30 ENCOUNTER — Other Ambulatory Visit: Payer: Self-pay

## 2019-09-30 DIAGNOSIS — I5022 Chronic systolic (congestive) heart failure: Secondary | ICD-10-CM

## 2019-09-30 NOTE — Progress Notes (Signed)
Nutrition Note  Reviewed food log and provided suggestions for foods to incorporate for balanced CBG and lower sodium.  She is interested in having a list of healthy foods (snacks and meals) that would be easy to prepare. She would like to create a flexible plan with RD for ways to continue making sustainable heart healthy changes. Will follow up on Monday.  Pt verbalized understanding. Will continue to monitor pt.  Andrey Campanile, MS, RDN, LDN

## 2019-10-03 ENCOUNTER — Encounter (HOSPITAL_COMMUNITY)
Admission: RE | Admit: 2019-10-03 | Discharge: 2019-10-03 | Disposition: A | Discharge status: Home / Self Care | Payer: BC Managed Care – PPO | Source: Ambulatory Visit | Attending: Cardiology | Admitting: Cardiology

## 2019-10-03 ENCOUNTER — Other Ambulatory Visit: Payer: Self-pay

## 2019-10-03 DIAGNOSIS — I5022 Chronic systolic (congestive) heart failure: Secondary | ICD-10-CM | POA: Diagnosis not present

## 2019-10-04 LAB — GLUCOSE, CAPILLARY: Glucose-Capillary: 159 mg/dL — ABNORMAL HIGH (ref 70–99)

## 2019-10-05 ENCOUNTER — Other Ambulatory Visit: Payer: Self-pay

## 2019-10-05 ENCOUNTER — Encounter (HOSPITAL_COMMUNITY)
Admission: RE | Admit: 2019-10-05 | Discharge: 2019-10-05 | Disposition: A | Discharge status: Home / Self Care | Payer: BC Managed Care – PPO | Source: Ambulatory Visit | Attending: Cardiology | Admitting: Cardiology

## 2019-10-05 DIAGNOSIS — I5022 Chronic systolic (congestive) heart failure: Secondary | ICD-10-CM | POA: Diagnosis not present

## 2019-10-07 ENCOUNTER — Other Ambulatory Visit: Payer: Self-pay

## 2019-10-07 ENCOUNTER — Encounter (HOSPITAL_COMMUNITY)
Admission: RE | Admit: 2019-10-07 | Discharge: 2019-10-07 | Disposition: A | Discharge status: Home / Self Care | Payer: BC Managed Care – PPO | Source: Ambulatory Visit | Attending: Cardiology | Admitting: Cardiology

## 2019-10-07 DIAGNOSIS — I5022 Chronic systolic (congestive) heart failure: Secondary | ICD-10-CM | POA: Diagnosis not present

## 2019-10-07 NOTE — Progress Notes (Signed)
I have reviewed a Home Exercise Prescription with Anna Barber . [name] is not currently exercising at home. The patient was advised to start walking 2-3 days a week for 15 minutes twice a day.  [name] and I discussed how to progress their exercise prescription. Pt states she also has access to pool near home. Will also incorporate some water aerobics in her home exercise routine. The patient stated that they understand the exercise prescription. We reviewed exercise guidelines, target heart rate during exercise, RPE Scale, weather conditions, NTG use, endpoints for exercise, warmup and cool down. Patient is encouraged to come to me with any questions. I will continue to follow up with the patient to assist them with progression and safety.    York Cerise MS, CEP CSCS 1:59 PM 10/07/2019

## 2019-10-10 ENCOUNTER — Other Ambulatory Visit: Payer: Self-pay

## 2019-10-10 ENCOUNTER — Encounter (HOSPITAL_COMMUNITY)
Admission: RE | Admit: 2019-10-10 | Discharge: 2019-10-10 | Disposition: A | Discharge status: Home / Self Care | Payer: BC Managed Care – PPO | Source: Ambulatory Visit | Attending: Cardiology | Admitting: Cardiology

## 2019-10-10 DIAGNOSIS — I5022 Chronic systolic (congestive) heart failure: Secondary | ICD-10-CM

## 2019-10-11 NOTE — Progress Notes (Addendum)
Cardiac Individual Treatment Plan  Patient Details  Name: Anna Barber MRN: [id number] Date of Birth: 1955-04-11 Referring Provider:     CARDIAC REHAB PHASE II ORIENTATION from 09/08/2019 in [facility] CARDIAC [facility]  Referring Provider [place], [place] J      Initial Encounter Date:    CARDIAC REHAB PHASE II ORIENTATION from 09/08/2019 in [facility] CARDIAC REHAB  Date 09/08/19      Visit Diagnosis: Heart failure, chronic systolic (HCC)  Patient's Home Medications on Admission:  Current Outpatient Medications:  .  carvedilol (COREG) 3.125 MG tablet, TAKE ONE TABLET TWICE DAILY, Disp: 60 tablet, Rfl: 1 .  clopidogrel (PLAVIX) 75 MG tablet, TAKE ONE TABLET EACH DAY, Disp: 30 tablet, Rfl: 2 .  CORLANOR 5 MG TABS tablet, TAKE ONE TABLET TWICE DAILY WITH A MEAL, Disp: 60 tablet, Rfl: 3 .  desonide (DESOWEN) 0.05 % cream, APPLY TWICE DAILY FOR SEBORRHEIC DERMATITIS ON FACE FOR 1 TO 2 WEEKS, Disp: , Rfl:  .  ENTRESTO 97-103 MG, TAKE ONE TABLET TWICE DAILY, Disp: 60 tablet, Rfl: 2 .  FARXIGA 10 MG TABS tablet, TAKE ONE TABLET DAILY, Disp: 30 tablet, Rfl: 3 .  metFORMIN (GLUCOPHAGE) 500 MG tablet, Take by mouth as directed. 2 tablets in the morning, 2 tablets in the evening, Disp: , Rfl:  .  nitroGLYCERIN (NITROSTAT) 0.4 MG SL tablet, Place 1 tablet (0.4 mg total) under the tongue every 5 (five) minutes as needed for chest pain., Disp: 30 tablet, Rfl: 2 .  pantoprazole (PROTONIX) 40 MG tablet, TAKE ONE TABLET EACH DAY, Disp: 30 tablet, Rfl: 2 .  rosuvastatin (CRESTOR) 20 MG tablet, TAKE ONE TABLET EACH DAY, Disp: 90 tablet, Rfl: 1  Past Medical History: Past Medical History:  Diagnosis Date  . Diabetes mellitus without complication (HCC)   . Hyperlipidemia   . Hypertension     Tobacco Use: Social History   Tobacco Use  Smoking Status Former Smoker  . Packs/day: 1.50  . Years: 15.00  . Pack years: 22.50  . Types: Cigarettes  . Quit date:  03/04/1987  . Years since quitting: 32.6  Smokeless Tobacco Never Used    Labs: Recent Review Advice worker    Labs for ITP Cardiac and Pulmonary Rehab Latest Ref Rng & Units 03/07/2019   Cholestrol 100 - 199 mg/dL 40(J)   LDLCALC 0 - 99 mg/dL 37   HDL >81 mg/dL 19(J)   Trlycerides 0 - 149 mg/dL 478      Capillary Blood Glucose: Lab Results  Component Value Date   GLUCAP 159 (H) 10/03/2019   GLUCAP 174 (H) 09/21/2019   GLUCAP 136 (H) 09/14/2019   GLUCAP 128 (H) 09/14/2019   GLUCAP 111 (H) 09/12/2019     Exercise Target Goals: Exercise Program Goal: Individual exercise prescription set using results from initial 6 min walk test and THRR while considering  patient's activity barriers and safety.   Exercise Prescription Goal: Starting with aerobic activity 30 plus minutes a day, 3 days per week for initial exercise prescription. Provide home exercise prescription and guidelines that participant acknowledges understanding prior to discharge.  Activity Barriers & Risk Stratification:  Activity Barriers & Cardiac Risk Stratification - 09/08/19 1116      Activity Barriers & Cardiac Risk Stratification   Activity Barriers Other (comment)    Comments Occasional Hip Pain    Cardiac Risk Stratification High           6 Minute Walk:  6 Minute Walk  Row Name 09/08/19 1115         6 Minute Walk   Phase Initial     Distance 1363 feet     Walk Time 6 minutes     # of Rest Breaks 0     MPH 2.6     METS 2.5     RPE 11     Perceived Dyspnea  0     VO2 Peak 8.8     Symptoms No     Resting HR 68 bpm     Resting BP 116/80     Resting Oxygen Saturation  98 %     Exercise Oxygen Saturation  during 6 min walk 97 %     Max Ex. HR 105 bpm     Max Ex. BP 139/78     2 Minute Post BP 124/78            Oxygen Initial Assessment:   Oxygen Re-Evaluation:   Oxygen Discharge (Final Oxygen Re-Evaluation):   Initial Exercise Prescription:  Initial Exercise  Prescription - 09/08/19 1100      Date of Initial Exercise RX and Referring Provider   Date 09/08/19    Referring Provider Truett Main[place] J    Expected Discharge Date 11/04/19      Treadmill   MPH 1.7    Grade 0    Minutes 15    METs 2.3      NuStep   Level 2    SPM 75    Minutes 15    METs 2.3      Prescription Details   Frequency (times per week) 2lbs    Duration Progress to 10 minutes continuous walking  at current work load and total walking time to 30-45 min      Intensity   THRR 40-80% of Max Heartrate 63-126    Ratings of Perceived Exertion 11-13    Perceived Dyspnea 0-4      Progression   Progression Continue progressive overload as per policy without signs/symptoms or physical distress.      Resistance Training   Training Prescription Yes    Weight 2lbs    Reps 10-15           Perform Capillary Blood Glucose checks as needed.  Exercise Prescription Changes:   Exercise Prescription Changes    Row Name 09/12/19 1102 09/28/19 1202 10/07/19 1400         Response to Exercise   Blood Pressure (Admit) 110/60 108/50 102/52     Blood Pressure (Exercise) 128/72 120/60 102/68     Blood Pressure (Exit) 118/60 106/56 108/60     Heart Rate (Admit) 78 bpm 73 bpm 68 bpm     Heart Rate (Exercise) 95 bpm 87 bpm 72 bpm     Heart Rate (Exit) 66 bpm 73 bpm 66 bpm     Rating of Perceived Exertion (Exercise) Perceived Dyspnea (Exercise) -- 0 0     Symptoms none None None     Comments Off to a good start with exercise.  None None     Duration Continue with 30 min of aerobic exercise without signs/symptoms of physical distress. Continue with 30 min of aerobic exercise without signs/symptoms of physical distress. Continue with 30 min of aerobic exercise without signs/symptoms of physical distress.     Intensity THRR unchanged THRR unchanged THRR unchanged       Progression   Progression Continue to progress workloads to  maintain intensity without  signs/symptoms of physical distress. Continue to progress workloads to maintain intensity without signs/symptoms of physical distress. Continue to progress workloads to maintain intensity without signs/symptoms of physical distress.     Average METs 2 2.1 1.8       Resistance Training   Training Prescription Yes No Yes     Weight 2lbs -- 3lbs     Reps 10-15 -- 10-15     Time 10 Minutes -- 10 Minutes       Interval Training   Interval Training No No --       Treadmill   MPH 1.7 1.7 1.7     Grade 0 0 0     Minutes 15 15 15      METs 2.3 2.3 2.3       NuStep   Level 2 2 2      SPM 75 75 70     Minutes 15 15 15      METs 1.6 2 1.5       Home Exercise Plan   Plans to continue exercise at -- -- Home (comment)  Walking     Frequency -- -- Add 2 additional days to program exercise sessions.     Initial Home Exercises Provided -- -- 10/07/19            Exercise Comments:   Exercise Comments    Row Name 09/12/19 1158 10/07/19 1405         Exercise Comments Patient tolerated first session of exercise well without symptoms. Reviewed HEP with pt. Pt is not exercising at home. Discussed with pt plan to start exercising at home. Will continue assist pt with plan for exercising in addition to cardiac rehab. Also gave pt information on [facility] PREP program for when she is done with cardiac rehab.             Exercise Goals and Review:   Exercise Goals    Row Name 09/08/19 1117             Exercise Goals   Increase Physical Activity Yes       Intervention Provide advice, education, support and counseling about physical activity/exercise needs.;Develop an individualized exercise prescription for aerobic and resistive training based on initial evaluation findings, risk stratification, comorbidities and participant's personal goals.       Expected Outcomes Short Term: Attend rehab on a regular basis to increase amount of physical activity.;Long Term: Add in home exercise to make  exercise part of routine and to increase amount of physical activity.;Long Term: Exercising regularly at least 3-5 days a week.       Increase Strength and Stamina Yes       Intervention Provide advice, education, support and counseling about physical activity/exercise needs.;Develop an individualized exercise prescription for aerobic and resistive training based on initial evaluation findings, risk stratification, comorbidities and participant's personal goals.       Expected Outcomes Short Term: Increase workloads from initial exercise prescription for resistance, speed, and METs.;Short Term: Perform resistance training exercises routinely during rehab and add in resistance training at home;Long Term: Improve cardiorespiratory fitness, muscular endurance and strength as measured by increased METs and functional capacity (09/14/19)       Able to understand and use rate of perceived exertion (RPE) scale Yes       Intervention Provide education and explanation on how to use RPE scale       Expected Outcomes Short Term: Able to use RPE daily in  rehab to express subjective intensity level;Long Term:  Able to use RPE to guide intensity level when exercising independently       Knowledge and understanding of Target Heart Rate Range (THRR) Yes       Intervention Provide education and explanation of THRR including how the numbers were predicted and where they are located for reference       Expected Outcomes Short Term: Able to state/look up THRR;Long Term: Able to use THRR to govern intensity when exercising independently;Short Term: Able to use daily as guideline for intensity in rehab       Able to check pulse independently Yes       Intervention Provide education and demonstration on how to check pulse in carotid and radial arteries.;Review the importance of being able to check your own pulse for safety during independent exercise       Expected Outcomes Short Term: Able to explain why pulse checking is  important during independent exercise;Long Term: Able to check pulse independently and accurately       Understanding of Exercise Prescription Yes       Intervention Provide education, explanation, and written materials on patient's individual exercise prescription       Expected Outcomes Short Term: Able to explain program exercise prescription;Long Term: Able to explain home exercise prescription to exercise independently              Exercise Goals Re-Evaluation :  Exercise Goals Re-Evaluation    Row Name 09/12/19 1625 10/07/19 1407           Exercise Goal Re-Evaluation   Exercise Goals Review Increase Physical Activity;Able to understand and use rate of perceived exertion (RPE) scale Increase Physical Activity;Increase Strength and Stamina;Able to understand and use rate of perceived exertion (RPE) scale;Knowledge and understanding of Target Heart Rate Range (THRR);Able to check pulse independently;Understanding of Exercise Prescription      Comments Patient able to understand and use RPE scale appropriately Reviewed HEP with pt. Discussed THRR, RPE Scale, weather precautions, endpoints of exercise, NTG use, warmup and cool down.      Expected Outcomes Increase workloads as tolerated to help increase cardiorespiratory fitness. Pt will plan to start walking 2-3 days a week for 15 minutes, 2x a day. Pt will continue to increase strength and stamina.              Discharge Exercise Prescription (Final Exercise Prescription Changes):  Exercise Prescription Changes - 10/07/19 1400      Response to Exercise   Blood Pressure (Admit) 102/52    Blood Pressure (Exercise) 102/68    Blood Pressure (Exit) 108/60    Heart Rate (Admit) 68 bpm    Heart Rate (Exercise) 72 bpm    Heart Rate (Exit) 66 bpm    Rating of Perceived Exertion (Exercise) 12    Perceived Dyspnea (Exercise) 0    Symptoms None    Comments None    Duration Continue with 30 min of aerobic exercise without signs/symptoms  of physical distress.    Intensity THRR unchanged      Progression   Progression Continue to progress workloads to maintain intensity without signs/symptoms of physical distress.    Average METs 1.8      Resistance Training   Training Prescription Yes    Weight 3lbs    Reps 10-15    Time 10 Minutes      Treadmill   MPH 1.7    Grade 0    Minutes 15  METs 2.3      NuStep   Level 2    SPM 70    Minutes 15    METs 1.5      Home Exercise Plan   Plans to continue exercise at Home (comment)   Walking   Frequency Add 2 additional days to program exercise sessions.    Initial Home Exercises Provided 10/07/19           Nutrition:  Target Goals: Understanding of nutrition guidelines, daily intake of sodium 1500mg , cholesterol 200mg , calories 30% from fat and 7% or less from saturated fats, daily to have 5 or more servings of fruits and vegetables.  Biometrics:  Pre Biometrics - 09/08/19 1118      Pre Biometrics   Height 5\' 4"  (1.626 m)    Weight 89.4 kg    Waist Circumference 42 inches    Hip Circumference 47 inches    Waist to Hip Ratio 0.89 %    BMI (Calculated) 33.81    Triceps Skinfold 30 mm    % Body Fat 44.7 %    Grip Strength 24 kg    Flexibility 17 in    Single Leg Stand 19.7 seconds            Nutrition Therapy Plan and Nutrition Goals:  Nutrition Therapy & Goals - 09/15/19 0825      Nutrition Therapy   Diet Heart Healthy/Carb Modified    Drug/Food Interactions Statins/Certain Fruits      Personal Nutrition Goals   Nutrition Goal Pt to limit carbs to <60 g per meal    Personal Goal #2 Pt to identify food quantities necessary to achieve weight loss of 6-24 lb at graduation from cardiac rehab.    Personal Goal #3 Pt to build a healthy plate including vegetables, fruits, whole grains, and low-fat dairy products in a heart healthy meal plan.      Intervention Plan   Intervention Prescribe, educate and counsel regarding individualized specific  dietary modifications aiming towards targeted core components such as weight, hypertension, lipid management, diabetes, heart failure and other comorbidities.;Nutrition handout(s) given to patient.    Expected Outcomes Long Term Goal: Adherence to prescribed nutrition plan.;Short Term Goal: Understand basic principles of dietary content, such as calories, fat, sodium, cholesterol and nutrients.           Nutrition Assessments:  Nutrition Assessments - 09/15/19 0827      MEDFICTS Scores   Pre Score 39           Nutrition Goals Re-Evaluation:  Nutrition Goals Re-Evaluation    Row Name 09/15/19 0827 10/04/19 0828           Goals   Current Weight 197 lb (89.4 kg) 197 lb (89.4 kg)      Nutrition Goal Pt to limit carbs to <60 g per meal Pt to limit carbs to <60 g per meal      Comment -- Pt to start using [employer] to keep a food log        Personal Goal #2 Re-Evaluation   Personal Goal #2 Pt to identify food quantities necessary to achieve weight loss of 6-24 lb at graduation from cardiac rehab. Pt to identify food quantities necessary to achieve weight loss of 6-24 lb at graduation from cardiac rehab.        Personal Goal #3 Re-Evaluation   Personal Goal #3 Pt to build a healthy plate including vegetables, fruits, whole grains, and low-fat dairy products in a heart  healthy meal plan. Pt to build a healthy plate including vegetables, fruits, whole grains, and low-fat dairy products in a heart healthy meal plan.             Nutrition Goals Discharge (Final Nutrition Goals Re-Evaluation):  Nutrition Goals Re-Evaluation - 10/04/19 0828      Goals   Current Weight 197 lb (89.4 kg)    Nutrition Goal Pt to limit carbs to <60 g per meal    Comment Pt to start using [employer] to keep a food log      Personal Goal #2 Re-Evaluation   Personal Goal #2 Pt to identify food quantities necessary to achieve weight loss of 6-24 lb at graduation from cardiac rehab.      Personal Goal  #3 Re-Evaluation   Personal Goal #3 Pt to build a healthy plate including vegetables, fruits, whole grains, and low-fat dairy products in a heart healthy meal plan.           Psychosocial: Target Goals: Acknowledge presence or absence of significant depression and/or stress, maximize coping skills, provide positive support system. Participant is able to verbalize types and ability to use techniques and skills needed for reducing stress and depression.  Initial Review & Psychosocial Screening:  Initial Psych Review & Screening - 09/08/19 1214      Initial Review   Current issues with None Identified      Family Dynamics   Good Support System? Yes   Angela lives alone but has friends and family who live nearby for support     Barriers   Psychosocial barriers to participate in program There are no identifiable barriers or psychosocial needs.      Screening Interventions   Interventions Encouraged to exercise           Quality of Life Scores:  Quality of Life - 09/08/19 1121      Quality of Life   Select Quality of Life      Quality of Life Scores   Health/Function Pre 26.57 %    Socioeconomic Pre 30 %    Psych/Spiritual Pre 27.86 %    Family Pre 28.5 %    GLOBAL Pre 27.77 %          Scores of 19 and below usually indicate a poorer quality of life in these areas.  A difference of  2-3 points is a clinically meaningful difference.  A difference of 2-3 points in the total score of the Quality of Life Index has been associated with significant improvement in overall quality of life, self-image, physical symptoms, and general health in studies assessing change in quality of life.  PHQ-9: Recent Review Flowsheet Data    Depression screen [facility] 2/9 09/08/2019 08/15/2014   Decreased Interest 0 0   Down, Depressed, Hopeless 0 0   PHQ - 2 Score 0 0     Interpretation of Total Score  Total Score Depression Severity:  1-4 = Minimal depression, 5-9 = Mild depression, 10-14 =  Moderate depression, 15-19 = Moderately severe depression, 20-27 = Severe depression   Psychosocial Evaluation and Intervention:   Psychosocial Re-Evaluation:  Psychosocial Re-Evaluation    Row Name 10/11/19 1526             Psychosocial Re-Evaluation   Current issues with None Identified       Comments Rivers has not reported any psychosocial issues or concerns at this time       Interventions Encouraged to attend Cardiac Rehabilitation for the  exercise       Continue Psychosocial Services  No Follow up required              Psychosocial Discharge (Final Psychosocial Re-Evaluation):  Psychosocial Re-Evaluation - 10/11/19 1526      Psychosocial Re-Evaluation   Current issues with None Identified    Comments Erlene has not reported any psychosocial issues or concerns at this time    Interventions Encouraged to attend Cardiac Rehabilitation for the exercise    Continue Psychosocial Services  No Follow up required           Vocational Rehabilitation: Provide vocational rehab assistance to qualifying candidates.   Vocational Rehab Evaluation & Intervention:  Vocational Rehab - 09/08/19 1216      Initial Vocational Rehab Evaluation & Intervention   Assessment shows need for Vocational Rehabilitation No           Education: Education Goals: Education classes will be provided on a weekly basis, covering required topics. Participant will state understanding/return demonstration of topics presented.  Learning Barriers/Preferences:  Learning Barriers/Preferences - 09/08/19 1127      Learning Barriers/Preferences   Learning Barriers None    Learning Preferences Audio;Individual Instruction;Skilled Demonstration;Written Material           Education Topics: Hypertension, Hypertension Reduction -Define heart disease and high blood pressure. Discus how high blood pressure affects the body and ways to reduce high blood pressure.   Exercise and Your  Heart -Discuss why it is important to exercise, the FITT principles of exercise, normal and abnormal responses to exercise, and how to exercise safely.   Angina -Discuss definition of angina, causes of angina, treatment of angina, and how to decrease risk of having angina.   Cardiac Medications -Review what the following cardiac medications are used for, how they affect the body, and side effects that may occur when taking the medications.  Medications include Aspirin, Beta blockers, calcium channel blockers, ACE Inhibitors, angiotensin receptor blockers, diuretics, digoxin, and antihyperlipidemics.   Congestive Heart Failure -Discuss the definition of CHF, how to live with CHF, the signs and symptoms of CHF, and how keep track of weight and sodium intake.   Heart Disease and Intimacy -Discus the effect sexual activity has on the heart, how changes occur during intimacy as we age, and safety during sexual activity.   Smoking Cessation / COPD -Discuss different methods to quit smoking, the health benefits of quitting smoking, and the definition of COPD.   Nutrition I: Fats -Discuss the types of cholesterol, what cholesterol does to the heart, and how cholesterol levels can be controlled.   Nutrition II: Labels -Discuss the different components of food labels and how to read food label   Heart Parts/Heart Disease and PAD -Discuss the anatomy of the heart, the pathway of blood circulation through the heart, and these are affected by heart disease.   Stress I: Signs and Symptoms -Discuss the causes of stress, how stress may lead to anxiety and depression, and ways to limit stress.   Stress II: Relaxation -Discuss different types of relaxation techniques to limit stress.   Warning Signs of Stroke / TIA -Discuss definition of a stroke, what the signs and symptoms are of a stroke, and how to identify when someone is having stroke.   Knowledge Questionnaire Score:  Knowledge  Questionnaire Score - 09/08/19 1121      Knowledge Questionnaire Score   Pre Score 22/24           Core Components/Risk Factors/Patient  Goals at Admission:  Personal Goals and Risk Factors at Admission - 09/08/19 1127      Core Components/Risk Factors/Patient Goals on Admission    Weight Management Yes;Weight Loss    Intervention Weight Management: Develop a combined nutrition and exercise program designed to reach desired caloric intake, while maintaining appropriate intake of nutrient and fiber, sodium and fats, and appropriate energy expenditure required for the weight goal.;Weight Management: Provide education and appropriate resources to help participant work on and attain dietary goals.;Weight Management/Obesity: Establish reasonable short term and long term weight goals.;Obesity: Provide education and appropriate resources to help participant work on and attain dietary goals.    Admit Weight 197 lb 1.5 oz (89.4 kg)    Goal Weight: Long Term 187 lb (84.8 kg)    Expected Outcomes Short Term: Continue to assess and modify interventions until short term weight is achieved;Long Term: Adherence to nutrition and physical activity/exercise program aimed toward attainment of established weight goal;Weight Loss: Understanding of general recommendations for a balanced deficit meal plan, which promotes 1-2 lb weight loss per week and includes a negative energy balance of [phone] kcal/d;Understanding recommendations for meals to include 15-35% energy as protein, 25-35% energy from fat, 35-60% energy from carbohydrates, less than 200mg  of dietary cholesterol, 20-35 gm of total fiber daily;Understanding of distribution of calorie intake throughout the day with the consumption of 4-5 meals/snacks    Diabetes Yes    Intervention Provide education about signs/symptoms and action to take for hypo/hyperglycemia.;Provide education about proper nutrition, including hydration, and aerobic/resistive exercise  prescription along with prescribed medications to achieve blood glucose in normal ranges: Fasting glucose 65-99 mg/dL    Expected Outcomes Short Term: Participant verbalizes understanding of the signs/symptoms and immediate care of hyper/hypoglycemia, proper foot care and importance of medication, aerobic/resistive exercise and nutrition plan for blood glucose control.;Long Term: Attainment of HbA1C < 7%.    Heart Failure Yes    Intervention Provide a combined exercise and nutrition program that is supplemented with education, support and counseling about heart failure. Directed toward relieving symptoms such as shortness of breath, decreased exercise tolerance, and extremity edema.    Expected Outcomes Improve functional capacity of life;Short term: Attendance in program 2-3 days a week with increased exercise capacity. Reported lower sodium intake. Reported increased fruit and vegetable intake. Reports medication compliance.;Short term: Daily weights obtained and reported for increase. Utilizing diuretic protocols set by physician.;Long term: Adoption of self-care skills and reduction of barriers for early signs and symptoms recognition and intervention leading to self-care maintenance.    Hypertension Yes    Intervention Provide education on lifestyle modifcations including regular physical activity/exercise, weight management, moderate sodium restriction and increased consumption of fresh fruit, vegetables, and low fat dairy, alcohol moderation, and smoking cessation.;Monitor prescription use compliance.    Expected Outcomes Short Term: Continued assessment and intervention until BP is < 140/3790mm HG in hypertensive participants. < 130/4480mm HG in hypertensive participants with diabetes, heart failure or chronic kidney disease.;Long Term: Maintenance of blood pressure at goal levels.    Lipids Yes    Intervention Provide education and support for participant on nutrition & aerobic/resistive exercise along  with prescribed medications to achieve LDL 70mg , HDL >40mg .    Expected Outcomes Long Term: Cholesterol controlled with medications as prescribed, with individualized exercise RX and with personalized nutrition plan. Value goals: LDL < 70mg , HDL > 40 mg.;Short Term: Participant states understanding of desired cholesterol values and is compliant with medications prescribed. Participant is following exercise prescription  and nutrition guidelines.           Core Components/Risk Factors/Patient Goals Review:   Goals and Risk Factor Review    Row Name 09/13/19 1012 10/11/19 1527 10/11/19 1533         Core Components/Risk Factors/Patient Goals Review   Personal Goals Review Weight Management/Obesity;Lipids;Diabetes;Hypertension Weight Management/Obesity;Lipids;Diabetes;Hypertension Weight Management/Obesity;Lipids;Diabetes;Hypertension     Review Angelise started her first day of exercise on 09/13/19 without difficulty Fay has been doing fair with exercise. Thalya has good attendance. Vicenta has lost 3.9 kg since starting exercise at cardiac rehab. Recent blood pressures have been in the upper 90's to low 100's will sent to Dr Rosemary Holms for review Charlisa has been doing fair with exercise. Lillion has good attendance. Dustyn has lost 3.9 kg since starting exercise at cardiac rehab.CBG's WNL Recent blood pressures have been in the upper 90's to low 100's will sent to Dr Rosemary Holms for review     Expected Outcomes Chantal will continue to partcipate in phase 2 cardiac rehab for exercise, nutrition and lifestyle modifications. Jaysa will continue to partcipate in phase 2 cardiac rehab for exercise, nutrition and lifestyle modifications. Crestina will continue to partcipate in phase 2 cardiac rehab for exercise, nutrition and lifestyle modifications.            Core Components/Risk Factors/Patient Goals at Discharge (Final Review):   Goals and Risk Factor Review - 10/11/19  1533      Core Components/Risk Factors/Patient Goals Review   Personal Goals Review Weight Management/Obesity;Lipids;Diabetes;Hypertension    Review Staphany has been doing fair with exercise. Abagail has good attendance. Dannah has lost 3.9 kg since starting exercise at cardiac rehab.CBG's WNL Recent blood pressures have been in the upper 90's to low 100's will sent to Dr Rosemary Holms for review    Expected Outcomes Karlissa will continue to partcipate in phase 2 cardiac rehab for exercise, nutrition and lifestyle modifications.           ITP Comments:  ITP Comments    Row Name 09/08/19 1115 09/12/19 1106 10/11/19 1524       ITP Comments Dr. Armanda Magic, Medical Director 30 Day ITP Review. Cheila started exercise on 09/12/19 and exercised without difficulty. 30 Day ITP Review. Sherma has good attendance and  fair participation in phase 2 cardiac rehab            Comments: See ITP comments. Will forward exercise flow sheets to Dr Damian Leavell office for review.Patient has been asymptomatic with exercise. Gladstone Lighter, RN,BSN 10/13/2019 11:55 AM

## 2019-10-12 ENCOUNTER — Other Ambulatory Visit: Payer: Self-pay

## 2019-10-12 ENCOUNTER — Encounter (HOSPITAL_COMMUNITY)
Admission: RE | Admit: 2019-10-12 | Discharge: 2019-10-12 | Disposition: A | Discharge status: Home / Self Care | Payer: BC Managed Care – PPO | Source: Ambulatory Visit | Attending: Cardiology | Admitting: Cardiology

## 2019-10-12 DIAGNOSIS — I5022 Chronic systolic (congestive) heart failure: Secondary | ICD-10-CM | POA: Diagnosis not present

## 2019-10-12 NOTE — Progress Notes (Signed)
Cardiac Rehab Note:  Patient presents to cardiac rehab with BP 94/58 prior to exercise. Max BP during exercise 110/50. Post exercise BP 90/50 sitting, 82/52 standing using manual cuff. Patient asymptomatic. BP rechecked using automatic cuff, 111/68 sitting, 84/53 standing. Patient denies s/s of orthostasis. Encouraged hydration up to patients prescribed fluid restriction. Cardiologist office notified. Spoke with Genia Harold, Medical Assistant to Dr. Joaquim Nam. Per Verlon Au, Dr. Joaquim Nam gave OK for patient to be discharged from cardiac rehab if asymptomatic. He also instructs patient to discontinue spironolactone 25 mg daily. Patient verbalizes understanding. She is provided education on changing positions slowly and encouraged to check her BP at home and record. Will continue to trend patient BP pre, during, and post CR exercise.  Leaner Morici E. Suzie Portela RN, BSN [place]. [facility]  Cardiac and Pulmonary Rehabilitation Phone: [phone] Fax: [phone]

## 2019-10-14 ENCOUNTER — Other Ambulatory Visit: Payer: Self-pay

## 2019-10-14 ENCOUNTER — Encounter (HOSPITAL_COMMUNITY)
Admission: RE | Admit: 2019-10-14 | Discharge: 2019-10-14 | Disposition: A | Discharge status: Home / Self Care | Payer: BC Managed Care – PPO | Source: Ambulatory Visit | Attending: Cardiology | Admitting: Cardiology

## 2019-10-14 DIAGNOSIS — I5022 Chronic systolic (congestive) heart failure: Secondary | ICD-10-CM

## 2019-10-17 ENCOUNTER — Other Ambulatory Visit: Payer: Self-pay | Admitting: Cardiology

## 2019-10-17 ENCOUNTER — Encounter (HOSPITAL_COMMUNITY)
Admission: RE | Admit: 2019-10-17 | Discharge: 2019-10-17 | Disposition: A | Discharge status: Home / Self Care | Payer: BC Managed Care – PPO | Source: Ambulatory Visit | Attending: Cardiology | Admitting: Cardiology

## 2019-10-17 ENCOUNTER — Other Ambulatory Visit: Payer: Self-pay

## 2019-10-17 DIAGNOSIS — I5022 Chronic systolic (congestive) heart failure: Secondary | ICD-10-CM

## 2019-10-19 ENCOUNTER — Other Ambulatory Visit: Payer: Self-pay

## 2019-10-19 ENCOUNTER — Encounter (HOSPITAL_COMMUNITY)
Admission: RE | Admit: 2019-10-19 | Discharge: 2019-10-19 | Disposition: A | Discharge status: Home / Self Care | Payer: BC Managed Care – PPO | Source: Ambulatory Visit | Attending: Cardiology | Admitting: Cardiology

## 2019-10-19 DIAGNOSIS — I5022 Chronic systolic (congestive) heart failure: Secondary | ICD-10-CM | POA: Diagnosis not present

## 2019-10-21 ENCOUNTER — Other Ambulatory Visit: Payer: Self-pay

## 2019-10-21 ENCOUNTER — Encounter (HOSPITAL_COMMUNITY)
Admission: RE | Admit: 2019-10-21 | Discharge: 2019-10-21 | Disposition: A | Discharge status: Home / Self Care | Payer: BC Managed Care – PPO | Source: Ambulatory Visit | Attending: Cardiology | Admitting: Cardiology

## 2019-10-21 DIAGNOSIS — I5022 Chronic systolic (congestive) heart failure: Secondary | ICD-10-CM

## 2019-10-24 ENCOUNTER — Other Ambulatory Visit: Payer: Self-pay

## 2019-10-24 ENCOUNTER — Encounter (HOSPITAL_COMMUNITY)
Admission: RE | Admit: 2019-10-24 | Discharge: 2019-10-24 | Disposition: A | Discharge status: Home / Self Care | Payer: BC Managed Care – PPO | Source: Ambulatory Visit | Attending: Cardiology | Admitting: Cardiology

## 2019-10-24 DIAGNOSIS — I5022 Chronic systolic (congestive) heart failure: Secondary | ICD-10-CM

## 2019-10-26 ENCOUNTER — Other Ambulatory Visit: Payer: Self-pay

## 2019-10-26 ENCOUNTER — Encounter (HOSPITAL_COMMUNITY)
Admission: RE | Admit: 2019-10-26 | Discharge: 2019-10-26 | Disposition: A | Discharge status: Home / Self Care | Payer: BC Managed Care – PPO | Source: Ambulatory Visit | Attending: Cardiology | Admitting: Cardiology

## 2019-10-26 DIAGNOSIS — I5022 Chronic systolic (congestive) heart failure: Secondary | ICD-10-CM | POA: Diagnosis not present

## 2019-10-28 ENCOUNTER — Encounter (HOSPITAL_COMMUNITY)
Admission: RE | Admit: 2019-10-28 | Discharge: 2019-10-28 | Disposition: A | Discharge status: Home / Self Care | Payer: BC Managed Care – PPO | Source: Ambulatory Visit | Attending: Cardiology | Admitting: Cardiology

## 2019-10-28 ENCOUNTER — Other Ambulatory Visit: Payer: Self-pay

## 2019-10-28 VITALS — Ht 64.0 in | Wt 193.1 lb

## 2019-10-28 DIAGNOSIS — I5022 Chronic systolic (congestive) heart failure: Secondary | ICD-10-CM

## 2019-10-31 ENCOUNTER — Encounter (HOSPITAL_COMMUNITY): Payer: BC Managed Care – PPO

## 2019-11-02 ENCOUNTER — Other Ambulatory Visit: Payer: Self-pay

## 2019-11-02 ENCOUNTER — Encounter (HOSPITAL_COMMUNITY)
Admission: RE | Admit: 2019-11-02 | Discharge: 2019-11-02 | Disposition: A | Discharge status: Home / Self Care | Payer: BC Managed Care – PPO | Source: Ambulatory Visit | Attending: Cardiology | Admitting: Cardiology

## 2019-11-02 DIAGNOSIS — I5022 Chronic systolic (congestive) heart failure: Secondary | ICD-10-CM

## 2019-11-02 NOTE — Progress Notes (Signed)
Discharge Progress Report  Patient Details  Name: Anna Barber MRN: [id number] Date of Birth: 01/27/1956 Referring Provider:     CARDIAC REHAB PHASE II ORIENTATION from 09/08/2019 in [facility]  Referring Provider Patwardhan, Manish J       Number of Visits: 21  Reason for Discharge:  Patient reached a stable level of exercise. Patient independent in their exercise. Patient has met program and personal goals.  Smoking History:  Social History   Tobacco Use  Smoking Status Former Smoker  . Packs/day: 1.50  . Years: 15.00  . Pack years: 22.50  . Types: Cigarettes  . Quit date: 03/04/1987  . Years since quitting: 32.7  Smokeless Tobacco Never Used    Diagnosis:  Heart failure, chronic systolic (HCC)  ADL UCSD:   Initial Exercise Prescription:  Initial Exercise Prescription - 09/08/19 1100      Date of Initial Exercise RX and Referring Provider   Date 09/08/19    Referring Provider Patwardhan, Manish J    Expected Discharge Date 11/04/19      Treadmill   MPH 1.7    Grade 0    Minutes 15    METs 2.3      NuStep   Level 2    SPM 75    Minutes 15    METs 2.3      Prescription Details   Frequency (times per week) 2lbs    Duration Progress to 10 minutes continuous walking  at current work load and total walking time to 30-45 min      Intensity   THRR 40-80% of Max Heartrate 63-126    Ratings of Perceived Exertion 11-13    Perceived Dyspnea 0-4      Progression   Progression Continue progressive overload as per policy without signs/symptoms or physical distress.      Resistance Training   Training Prescription Yes    Weight 2lbs    Reps 10-15           Discharge Exercise Prescription (Final Exercise Prescription Changes):  Exercise Prescription Changes - 11/02/19 0932      Response to Exercise   Blood Pressure (Admit) 108/62    Blood Pressure (Exercise) 106/68    Blood Pressure (Exit) 104/60    Heart Rate  (Admit) 67 bpm    Heart Rate (Exercise) 76 bpm    Heart Rate (Exit) 57 bpm    Rating of Perceived Exertion (Exercise) 11    Perceived Dyspnea (Exercise) 0    Symptoms None    Comments None    Duration Continue with 30 min of aerobic exercise without signs/symptoms of physical distress.    Intensity THRR unchanged      Progression   Progression Continue to progress workloads to maintain intensity without signs/symptoms of physical distress.    Average METs 1.7      Resistance Training   Training Prescription No      NuStep   Level 2    SPM 85    Minutes 15    METs 1.7      Home Exercise Plan   Plans to continue exercise at Home (comment)   Walking, Water Aerobics   Frequency Add 2 additional days to program exercise sessions.    Initial Home Exercises Provided 10/28/19           Functional Capacity:  6 Minute Walk    Row Name 09/08/19 1115 10/28/19 1629       6   Minute Walk   Phase Initial Discharge    Distance 1363 feet 1461 feet    Distance % Change -- 7.19 %    Distance Feet Change -- 98 ft    Walk Time 6 minutes 6 minutes    # of Rest Breaks 0 0    MPH 2.6 2.8    METS 2.5 2.9    RPE 11 12    Perceived Dyspnea  0 0    VO2 Peak 8.8 10    Symptoms No No    Resting HR 68 bpm 67 bpm    Resting BP 116/80 110/56    Resting Oxygen Saturation  98 % --    Exercise Oxygen Saturation  during 6 min walk 97 % --    Max Ex. HR 105 bpm 70 bpm    Max Ex. BP 139/78 122/60    2 Minute Post BP 124/78 112/68           Psychological, QOL, Others - Outcomes: PHQ 2/9: Depression screen PHQ 2/9 11/02/2019 09/08/2019 08/15/2014  Decreased Interest 0 0 0  Down, Depressed, Hopeless 0 0 0  PHQ - 2 Score 0 0 0    Quality of Life:  Quality of Life - 09/08/19 1121      Quality of Life   Select Quality of Life      Quality of Life Scores   Health/Function Pre 26.57 %    Socioeconomic Pre 30 %    Psych/Spiritual Pre 27.86 %    Family Pre 28.5 %    GLOBAL Pre 27.77 %            Personal Goals: Goals established at orientation with interventions provided to work toward goal.  Personal Goals and Risk Factors at Admission - 09/08/19 1127      Core Components/Risk Factors/Patient Goals on Admission    Weight Management Yes;Weight Loss    Intervention Weight Management: Develop a combined nutrition and exercise program designed to reach desired caloric intake, while maintaining appropriate intake of nutrient and fiber, sodium and fats, and appropriate energy expenditure required for the weight goal.;Weight Management: Provide education and appropriate resources to help participant work on and attain dietary goals.;Weight Management/Obesity: Establish reasonable short term and long term weight goals.;Obesity: Provide education and appropriate resources to help participant work on and attain dietary goals.    Admit Weight 197 lb 1.5 oz (89.4 kg)    Goal Weight: Long Term 187 lb (84.8 kg)    Expected Outcomes Short Term: Continue to assess and modify interventions until short term weight is achieved;Long Term: Adherence to nutrition and physical activity/exercise program aimed toward attainment of established weight goal;Weight Loss: Understanding of general recommendations for a balanced deficit meal plan, which promotes 1-2 lb weight loss per week and includes a negative energy balance of 500-1000 kcal/d;Understanding recommendations for meals to include 15-35% energy as protein, 25-35% energy from fat, 35-60% energy from carbohydrates, less than 200mg of dietary cholesterol, 20-35 gm of total fiber daily;Understanding of distribution of calorie intake throughout the day with the consumption of 4-5 meals/snacks    Diabetes Yes    Intervention Provide education about signs/symptoms and action to take for hypo/hyperglycemia.;Provide education about proper nutrition, including hydration, and aerobic/resistive exercise prescription along with prescribed medications to achieve  blood glucose in normal ranges: Fasting glucose 65-99 mg/dL    Expected Outcomes Short Term: Participant verbalizes understanding of the signs/symptoms and immediate care of hyper/hypoglycemia, proper foot care and importance of medication,   aerobic/resistive exercise and nutrition plan for blood glucose control.;Long Term: Attainment of HbA1C < 7%.    Heart Failure Yes    Intervention Provide a combined exercise and nutrition program that is supplemented with education, support and counseling about heart failure. Directed toward relieving symptoms such as shortness of breath, decreased exercise tolerance, and extremity edema.    Expected Outcomes Improve functional capacity of life;Short term: Attendance in program 2-3 days a week with increased exercise capacity. Reported lower sodium intake. Reported increased fruit and vegetable intake. Reports medication compliance.;Short term: Daily weights obtained and reported for increase. Utilizing diuretic protocols set by physician.;Long term: Adoption of self-care skills and reduction of barriers for early signs and symptoms recognition and intervention leading to self-care maintenance.    Hypertension Yes    Intervention Provide education on lifestyle modifcations including regular physical activity/exercise, weight management, moderate sodium restriction and increased consumption of fresh fruit, vegetables, and low fat dairy, alcohol moderation, and smoking cessation.;Monitor prescription use compliance.    Expected Outcomes Short Term: Continued assessment and intervention until BP is < 140/83m HG in hypertensive participants. < 130/843mHG in hypertensive participants with diabetes, heart failure or chronic kidney disease.;Long Term: Maintenance of blood pressure at goal levels.    Lipids Yes    Intervention Provide education and support for participant on nutrition & aerobic/resistive exercise along with prescribed medications to achieve LDL <7034mHDL  >41m28m  Expected Outcomes Long Term: Cholesterol controlled with medications as prescribed, with individualized exercise RX and with personalized nutrition plan. Value goals: LDL < 70mg18mL > 40 mg.;Short Term: Participant states understanding of desired cholesterol values and is compliant with medications prescribed. Participant is following exercise prescription and nutrition guidelines.            Personal Goals Discharge:  Goals and Risk Factor Review    Row Name 09/13/19 1012 10/11/19 1527 10/11/19 1533         Core Components/Risk Factors/Patient Goals Review   Personal Goals Review Weight Management/Obesity;Lipids;Diabetes;Hypertension Weight Management/Obesity;Lipids;Diabetes;Hypertension Weight Management/Obesity;Lipids;Diabetes;Hypertension     Review Anna Barber her first day of exercise on 09/13/19 without difficulty Anna Barber doing fair with exercise. Anna Barber attendance. Anna Barber 3.9 kg since starting exercise at cardiac rehab. Recent blood pressures have been in the upper 90's to low 100's will sent to Dr PatwaVirgina Jockreview Anna Barber doing fair with exercise. Anna Barber attendance. Anna Barber 3.9 kg since starting exercise at cardiac rehab.CBG's WNL Recent blood pressures have been in the upper 90's to low 100's will sent to Dr PatwaVirgina Jockreview     Expected Outcomes Anna Barber continue to partcipate in phase 2 cardiac rehab for exercise, nutrition and lifestyle modifications. Anna Barber continue to partcipate in phase 2 cardiac rehab for exercise, nutrition and lifestyle modifications. Anna Barber continue to partcipate in phase 2 cardiac rehab for exercise, nutrition and lifestyle modifications.            Exercise Goals and Review:  Exercise Goals    Row Name 09/08/19 1117             Exercise Goals   Increase Physical Activity Yes       Intervention Provide advice, education, support and  counseling about physical activity/exercise needs.;Develop an individualized exercise prescription for aerobic and resistive training based on initial evaluation findings, risk stratification, comorbidities and participant's personal goals.       Expected Outcomes Short Term: Attend rehab  on a regular basis to increase amount of physical activity.;Long Term: Add in home exercise to make exercise part of routine and to increase amount of physical activity.;Long Term: Exercising regularly at least 3-5 days a week.       Increase Strength and Stamina Yes       Intervention Provide advice, education, support and counseling about physical activity/exercise needs.;Develop an individualized exercise prescription for aerobic and resistive training based on initial evaluation findings, risk stratification, comorbidities and participant's personal goals.       Expected Outcomes Short Term: Increase workloads from initial exercise prescription for resistance, speed, and METs.;Short Term: Perform resistance training exercises routinely during rehab and add in resistance training at home;Long Term: Improve cardiorespiratory fitness, muscular endurance and strength as measured by increased METs and functional capacity (6MWT)       Able to understand and use rate of perceived exertion (RPE) scale Yes       Intervention Provide education and explanation on how to use RPE scale       Expected Outcomes Short Term: Able to use RPE daily in rehab to express subjective intensity level;Long Term:  Able to use RPE to guide intensity level when exercising independently       Knowledge and understanding of Target Heart Rate Range (THRR) Yes       Intervention Provide education and explanation of THRR including how the numbers were predicted and where they are located for reference       Expected Outcomes Short Term: Able to state/look up THRR;Long Term: Able to use THRR to govern intensity when exercising independently;Short Term:  Able to use daily as guideline for intensity in rehab       Able to check pulse independently Yes       Intervention Provide education and demonstration on how to check pulse in carotid and radial arteries.;Review the importance of being able to check your own pulse for safety during independent exercise       Expected Outcomes Short Term: Able to explain why pulse checking is important during independent exercise;Long Term: Able to check pulse independently and accurately       Understanding of Exercise Prescription Yes       Intervention Provide education, explanation, and written materials on patient's individual exercise prescription       Expected Outcomes Short Term: Able to explain program exercise prescription;Long Term: Able to explain home exercise prescription to exercise independently              Exercise Goals Re-Evaluation:  Exercise Goals Re-Evaluation    Row Name 09/12/19 1625 10/07/19 1407 11/03/19 0935         Exercise Goal Re-Evaluation   Exercise Goals Review Increase Physical Activity;Able to understand and use rate of perceived exertion (RPE) scale Increase Physical Activity;Increase Strength and Stamina;Able to understand and use rate of perceived exertion (RPE) scale;Knowledge and understanding of Target Heart Rate Range (THRR);Able to check pulse independently;Understanding of Exercise Prescription Increase Physical Activity;Increase Strength and Stamina;Able to understand and use rate of perceived exertion (RPE) scale;Knowledge and understanding of Target Heart Rate Range (THRR);Able to check pulse independently;Understanding of Exercise Prescription     Comments Patient able to understand and use RPE scale appropriately Reviewed HEP with pt. Discussed THRR, RPE Scale, weather precautions, endpoints of exercise, NTG use, warmup and cool down. Pt completed 21 sessions of cardiac rehab. Pt increased her functional capacity by 7.19% and post 6mwt distance by 98ft. Pt also  increased   her balance and was able to lose 4 lbs since starting the program.     Expected Outcomes Increase workloads as tolerated to help increase cardiorespiratory fitness. Pt will plan to start walking 2-3 days a week for 15 minutes, 2x a day. Pt will continue to increase strength and stamina. Pt will walk/water aerobics 3-4 days at home for exercise.            Nutrition & Weight - Outcomes:  Pre Biometrics - 09/08/19 1118      Pre Biometrics   Height 5' 4" (1.626 m)    Weight 89.4 kg    Waist Circumference 42 inches    Hip Circumference 47 inches    Waist to Hip Ratio 0.89 %    BMI (Calculated) 33.81    Triceps Skinfold 30 mm    % Body Fat 44.7 %    Grip Strength 24 kg    Flexibility 17 in    Single Leg Stand 19.7 seconds           Post Biometrics - 10/28/19 1630       Post  Biometrics   Height 5' 4" (1.626 m)    Weight 87.6 kg    Waist Circumference 43 inches    Hip Circumference 46 inches    Waist to Hip Ratio 0.93 %    BMI (Calculated) 33.13    Triceps Skinfold 30 mm    % Body Fat 45.1 %    Grip Strength 27 kg    Flexibility 14.5 in    Single Leg Stand 30 seconds           Nutrition:  Nutrition Therapy & Goals - 09/15/19 0825      Nutrition Therapy   Diet Heart Healthy/Carb Modified    Drug/Food Interactions Statins/Certain Fruits      Personal Nutrition Goals   Nutrition Goal Pt to limit carbs to <60 g per meal    Personal Goal #2 Pt to identify food quantities necessary to achieve weight loss of 6-24 lb at graduation from cardiac rehab.    Personal Goal #3 Pt to build a healthy plate including vegetables, fruits, whole grains, and low-fat dairy products in a heart healthy meal plan.      Intervention Plan   Intervention Prescribe, educate and counsel regarding individualized specific dietary modifications aiming towards targeted core components such as weight, hypertension, lipid management, diabetes, heart failure and other comorbidities.;Nutrition  handout(s) given to patient.    Expected Outcomes Long Term Goal: Adherence to prescribed nutrition plan.;Short Term Goal: Understand basic principles of dietary content, such as calories, fat, sodium, cholesterol and nutrients.           Nutrition Discharge:  Nutrition Assessments - 11/08/19 0705      MEDFICTS Scores   Post Score --   did not return          Education Questionnaire Score:  Knowledge Questionnaire Score - 09/08/19 1121      Knowledge Questionnaire Score   Pre Score 22/24           Goals reviewed with patient; copy given to patient.Pt graduated from cardiac rehab program on 11/02/19 with completion of 21 exercise sessions in Phase II. Pt maintained good attendance and progressed nicely during his participation in rehab as evidenced by increased MET level.   Medication list reconciled. Repeat  PHQ score-0  .  Pt has made significant lifestyle changes and should be commended for her success. Pt feels she has  achieved her goals during cardiac rehab.   Pt plans to continue exercise by walking and is interested in participating in the prep program. Anna Barber increased her distance by 98 feet and lost 2.5 kg since participating in phase 2 cardiac rehab. We are proud of Anna Barber's progress!Maria Whitaker, RN,BSN 11/09/2019 1:32 PM 

## 2019-11-04 ENCOUNTER — Encounter (HOSPITAL_COMMUNITY): Payer: BC Managed Care – PPO

## 2019-11-07 ENCOUNTER — Encounter: Payer: Self-pay | Admitting: Cardiology

## 2019-11-07 ENCOUNTER — Other Ambulatory Visit: Payer: Self-pay

## 2019-11-07 ENCOUNTER — Ambulatory Visit: Payer: BC Managed Care – PPO | Admitting: Cardiology

## 2019-11-07 VITALS — BP 119/54 | HR 54 | Resp 16 | Ht 64.0 in | Wt 194.0 lb

## 2019-11-07 DIAGNOSIS — I252 Old myocardial infarction: Secondary | ICD-10-CM

## 2019-11-07 DIAGNOSIS — I2510 Atherosclerotic heart disease of native coronary artery without angina pectoris: Secondary | ICD-10-CM

## 2019-11-07 DIAGNOSIS — I255 Ischemic cardiomyopathy: Secondary | ICD-10-CM

## 2019-11-07 DIAGNOSIS — I5020 Unspecified systolic (congestive) heart failure: Secondary | ICD-10-CM

## 2019-11-07 NOTE — Progress Notes (Signed)
Follow up visit  Subjective:   Anna Barber, female    DOB: 12-15-1955, 64 y.o.   MRN: [id number]   64 year old Caucasian female with ischemic cardiomyopathy, completed anterior MI in 01/2019, type 2 diabetes mellitus, strong family history of early CAD.  Patient suffered a prolonged episode of chest pain, shortness of breath, diaphoresis in 01/2019 for which she did not seek immediate medical attention. EKG, echocardiogram, stress test, and cardiac MRI point to a completed anterior infarct with no viability, no ischemia in rest of the myocardium, EF 35%.   Patient is doing well. She denies chest pain, shortness of breath, palpitations, leg edema, orthopnea, PND, TIA/syncope. Patient completed cardiac rehab, and has been feeling great.    Current Outpatient Medications on File Prior to Visit  Medication Sig Dispense Refill  . carvedilol (COREG) 3.125 MG tablet TAKE ONE TABLET TWICE DAILY 60 tablet 1  . clopidogrel (PLAVIX) 75 MG tablet TAKE ONE TABLET EACH DAY 30 tablet 2  . CORLANOR 5 MG TABS tablet TAKE ONE TABLET TWICE DAILY WITH A MEAL 60 tablet 3  . desonide (DESOWEN) 0.05 % cream APPLY TWICE DAILY FOR SEBORRHEIC DERMATITIS ON FACE FOR 1 TO 2 WEEKS    . ENTRESTO 97-103 MG TAKE ONE TABLET TWICE DAILY 60 tablet 2  . FARXIGA 10 MG TABS tablet TAKE ONE TABLET DAILY 30 tablet 3  . metFORMIN (GLUCOPHAGE) 500 MG tablet Take by mouth as directed. 2 tablets in the morning, 2 tablets in the evening    . nitroGLYCERIN (NITROSTAT) 0.4 MG SL tablet Place 1 tablet (0.4 mg total) under the tongue every 5 (five) minutes as needed for chest pain. 30 tablet 2  . pantoprazole (PROTONIX) 40 MG tablet TAKE ONE TABLET EACH DAY 30 tablet 2  . rosuvastatin (CRESTOR) 20 MG tablet TAKE ONE TABLET EACH DAY 90 tablet 1   No current facility-administered medications on file prior to visit.    Cardiovascular & other pertient studies:  EKG 11/07/2019: Sinus rhythm 54 bpm Old anterolateral infarct. Low  voltage  Echocardiogram 05/24/2019:  Left ventricle cavity is normal in size. Normal left ventricular wall  thickness. Akinetic mid to distal anteroseptal, anteroapical myocardium.  LVEF 40-45%. Mildly depressed LV systolic function with EF 45%. Normal  global wall motion. Indeterminate diastolic filling pattern.  Left atrial cavity is mildly dilated.  Trileaflet aortic valve. Mild (Grade I) aortic regurgitation.  Moderate (Grade II) mitral regurgitation.  Mild tricuspid regurgitation.  No evidence of pulmonary hypertension.  Compared to previous study on 03/09/2019, EF has improved from 25%.  Cardiac MRI 03/31/2019: 1. Mildly dilated LV with EF 35%. Wall motion abnormalities as noted above. 2.  Normal RV size and systolic function. 3. LGE pattern suggests prior LAD-territory MI. The apex, the peri-apical segments, and the mid anterior wall and mid anteroseptal wall are unlikely to improve with revascularization (likely non-viable).  EKG 03/21/2019: Sinus tachycardia 105 bpm. Anterior infarct, age indeterminate. Low voltage.  Lexiscan Sestamibi Stress Test12/16/2020: Nondiagnostic ECG stress. Resting EKG/ECG demonstrated normal sinus rhythm. Observed anterolateral infarct. Peak EKG/ECG no change with Lexiscan infusion. Patient developed chest pain level 2 of 10, and exhibited chest discomfort.  Extensive fixed perfusion abnormality with no reversibility noted in the mid to distal anterior, anteroseptal, apical and mid to distal inferoseptal and inferoapical region suggestive of a completed infarct in the LAD territory. Gated images reveal akinesis in the same region. LVEF severely dysfunctional at 26%. High risk study, no prior study for comparison.  Recent labs:  06/30/2019: Glucose 147. BUN/Cr 16/0.83. eGFR 75. Na/K 141/4.7 BNP 169  05/16/2019: Glucose 146. BUN/Cr 24/0.78. eGFR 81. Na/K 138/5.5 BNP 87   04/29/2019: BNP 880.4. Creatinine 0.82, eGFR 76, K 3.7, Na 142, Glucose  136, BMP otherwise normal.  03/31/2019: BNP 494 Glucose 115. BUN/Cr 7/0.74. eGFR 87. Na/K 141/3.9  03/07/2019: Chol 90, TG 131, HDL 30, LDL 37 Lipoprotein (a) 52, normal  02/28/2019:  Glucose 130, Cr 0.67. EGFR 89. K 4.2.  H/H 10.5/32.9. MCV normal. Platelets 540   Review of Systems  Cardiovascular: Negative for chest pain, dyspnea on exertion, leg swelling, palpitations and syncope.        Vitals:   11/07/19 1045  BP: (!) 119/54  Pulse: (!) 54  Resp: 16  SpO2: 98%    Body mass index is 33.3 kg/m. Filed Weights   11/07/19 1045  Weight: 194 lb (88 kg)     Objective:   Physical Exam Vitals and nursing note reviewed.  Constitutional:      Appearance: She is well-developed.  Neck:     Vascular: No JVD.  Cardiovascular:     Rate and Rhythm: Normal rate and regular rhythm.     Pulses: Intact distal pulses.     Heart sounds: Normal heart sounds. No murmur heard.   Pulmonary:     Effort: Pulmonary effort is normal.     Breath sounds: Normal breath sounds. No wheezing or rales.           Assessment & Recommendations:   64 year old Caucasian female with ischemic cardiomyopathy, new onset HFrEF, competed anterior MI in 01/2019, type 2 diabetes mellitus, strong family history of early CAD  HFrEF: Ischemic cardiomyopathy with completed anterior infarct-suffered while at home, did not seek immediate medical attention. (01/2019) No viability in LAD territory on cardiac MRI. LVEF improved to 40-45% on echo (05/2019) Currently on Entresto to 97-103 mg BID. Continue Corlanor 5 mg daily, Coreg 3.125 mg,Farxiga 10 mg daily, as needed lasix.   CAD: No angina symptoms at this time. On appropriate medical therapy. Patient suffered a prolonged episode of chest pain, shortness of breath, diaphoresis in 01/2019 for which she did not seek immediate medical attention. EKG, echocardiogram, stress test, and cardiac MRI point to a completed anterior infarct with no viability, no  ischemia in rest of the myocardium. Continue medical therapy at this time. Continue rosuvastatin 20 mg. Given mild anemia, stopped aspirin. Continue plavix.   Recommend holding plavix 5 days before any invasive procedure.   F/u in 6 months  Jacoria Keiffer Esther Hardy, MD [facility] Pager: [phone] Office: [phone]

## 2019-11-19 ENCOUNTER — Other Ambulatory Visit: Payer: Self-pay | Admitting: Cardiology

## 2019-12-01 ENCOUNTER — Other Ambulatory Visit: Payer: Self-pay | Admitting: Cardiology

## 2019-12-01 DIAGNOSIS — I5020 Unspecified systolic (congestive) heart failure: Secondary | ICD-10-CM

## 2019-12-16 ENCOUNTER — Other Ambulatory Visit: Payer: Self-pay | Admitting: Cardiology

## 2019-12-16 DIAGNOSIS — I5020 Unspecified systolic (congestive) heart failure: Secondary | ICD-10-CM

## 2019-12-16 DIAGNOSIS — I255 Ischemic cardiomyopathy: Secondary | ICD-10-CM

## 2019-12-21 ENCOUNTER — Other Ambulatory Visit: Payer: Self-pay | Admitting: Cardiology

## 2019-12-28 ENCOUNTER — Other Ambulatory Visit: Payer: Self-pay | Admitting: Cardiology

## 2020-03-09 ENCOUNTER — Other Ambulatory Visit: Payer: Self-pay | Admitting: Cardiology

## 2020-03-09 DIAGNOSIS — I5020 Unspecified systolic (congestive) heart failure: Secondary | ICD-10-CM

## 2020-03-14 ENCOUNTER — Other Ambulatory Visit: Payer: Self-pay | Admitting: Cardiology

## 2020-03-26 ENCOUNTER — Other Ambulatory Visit: Payer: Self-pay | Admitting: Cardiology

## 2020-04-12 ENCOUNTER — Other Ambulatory Visit: Payer: Self-pay | Admitting: Cardiology

## 2020-04-12 DIAGNOSIS — I255 Ischemic cardiomyopathy: Secondary | ICD-10-CM

## 2020-04-12 DIAGNOSIS — I5020 Unspecified systolic (congestive) heart failure: Secondary | ICD-10-CM

## 2020-05-07 NOTE — Progress Notes (Unsigned)
Follow up visit  Subjective:   Anna Barber, female    DOB: [birth year], 65 y.o.   MRN: [id number]   65 year old Caucasian female with ischemic cardiomyopathy, completed anterior MI in 01/2019, type 2 diabetes mellitus, strong family history of early CAD.  Patient suffered a prolonged episode of chest pain, shortness of breath, diaphoresis in 01/2019 for which she did not seek immediate medical attention. EKG, echocardiogram, stress test, and cardiac MRI point to a completed anterior infarct with no viability, no ischemia in rest of the myocardium, EF 35%.   Patient is doing well. She denies chest pain, shortness of breath, palpitations, leg edema, orthopnea, PND, TIA/syncope. Patient completed cardiac rehab, and has been feeling great.  Except for one episode of upper respiratory infection few weeks ago, she has not experienced any shortness of breath.   Current Outpatient Medications on File Prior to Visit  Medication Sig Dispense Refill  . carvedilol (COREG) 3.125 MG tablet TAKE ONE TABLET TWICE DAILY 60 tablet 2  . clopidogrel (PLAVIX) 75 MG tablet TAKE ONE TABLET EACH DAY 30 tablet 2  . CORLANOR 5 MG TABS tablet TAKE ONE TABLET TWICE DAILY WITH A MEAL 60 tablet 3  . desonide (DESOWEN) 0.05 % cream APPLY TWICE DAILY FOR SEBORRHEIC DERMATITIS ON FACE FOR 1 TO 2 WEEKS    . ENTRESTO 97-103 MG TAKE ONE TABLET TWICE DAILY 60 tablet 2  . FARXIGA 10 MG TABS tablet TAKE ONE TABLET DAILY 90 tablet 0  . metFORMIN (GLUCOPHAGE) 500 MG tablet Take by mouth as directed. 2 tablets in the morning, 2 tablets in the evening    . nitroGLYCERIN (NITROSTAT) 0.4 MG SL tablet Place 1 tablet (0.4 mg total) under the tongue every 5 (five) minutes as needed for chest pain. 30 tablet 2  . pantoprazole (PROTONIX) 40 MG tablet TAKE ONE TABLET EACH DAY 30 tablet 2  . rosuvastatin (CRESTOR) 20 MG tablet TAKE ONE TABLET EACH DAY 90 tablet 1   No current facility-administered medications on file prior to visit.     Cardiovascular & other pertient studies:  EKG 05/09/2020: Sinus rhythm 71 bpm Old anterior infarct  Echocardiogram 05/24/2019:  Left ventricle cavity is normal in size. Normal left ventricular wall  thickness. Akinetic mid to distal anteroseptal, anteroapical myocardium.  LVEF 40-45%. Mildly depressed LV systolic function with EF 45%. Normal  global wall motion. Indeterminate diastolic filling pattern.  Left atrial cavity is mildly dilated.  Trileaflet aortic valve. Mild (Grade I) aortic regurgitation.  Moderate (Grade II) mitral regurgitation.  Mild tricuspid regurgitation.  No evidence of pulmonary hypertension.  Compared to previous study on 03/09/2019, EF has improved from 25%.  Cardiac MRI 03/31/2019: 1. Mildly dilated LV with EF 35%. Wall motion abnormalities as noted above. 2.  Normal RV size and systolic function. 3. LGE pattern suggests prior LAD-territory MI. The apex, the peri-apical segments, and the mid anterior wall and mid anteroseptal wall are unlikely to improve with revascularization (likely non-viable).  EKG 03/21/2019: Sinus tachycardia 105 bpm. Anterior infarct, age indeterminate. Low voltage.  Lexiscan Sestamibi Stress Test12/16/2020: Nondiagnostic ECG stress. Resting EKG/ECG demonstrated normal sinus rhythm. Observed anterolateral infarct. Peak EKG/ECG no change with Lexiscan infusion. Patient developed chest pain level 2 of 10, and exhibited chest discomfort.  Extensive fixed perfusion abnormality with no reversibility noted in the mid to distal anterior, anteroseptal, apical and mid to distal inferoseptal and inferoapical region suggestive of a completed infarct in the LAD territory. Gated images reveal akinesis in the same  region. LVEF severely dysfunctional at 26%. High risk study, no prior study for comparison.  Recent labs: 11/09/2019: Glucose 120, BUN/Cr 9/0.8. EGFR 75. HbA1C 5.8% Chol 136, TG 70, HDL 56, LDL 66 TSH 2.1  normal  03/07/2019: Chol 90, TG 131, HDL 30, LDL 37 Lipoprotein (a) 52, normal  02/28/2019:  Glucose 130, Cr 0.67. EGFR 89. K 4.2.  H/H 10.5/32.9. MCV normal. Platelets 540   Review of Systems  Cardiovascular: Negative for chest pain, dyspnea on exertion, leg swelling, palpitations and syncope.        Vitals:   05/09/20 0943  BP: (!) 120/56  Pulse: 71  Resp: 16  Temp: (!) 97.2 F (36.2 C)  SpO2: 99%     Body mass index is 34.84 kg/m. Filed Weights   05/09/20 0943  Weight: 203 lb (92.1 kg)     Objective:   Physical Exam Vitals and nursing note reviewed.  Constitutional:      Appearance: She is well-developed.  Neck:     Vascular: No JVD.  Cardiovascular:     Rate and Rhythm: Normal rate and regular rhythm.     Pulses: Intact distal pulses.     Heart sounds: Normal heart sounds. No murmur heard.   Pulmonary:     Effort: Pulmonary effort is normal.     Breath sounds: Normal breath sounds. No wheezing or rales.           Assessment & Recommendations:   65 year old Caucasian female with ischemic cardiomyopathy, new onset HFrEF, competed anterior MI in 01/2019, type 2 diabetes mellitus, strong family history of early CAD  HFrEF: Ischemic cardiomyopathy with completed anterior infarct-suffered while at home, did not seek immediate medical attention. (01/2019) No viability in LAD territory on cardiac MRI. LVEF improved to 40-45% on echo (05/2019) Currently on Entresto to 97-103 mg BID. Continue Corlanor 5 mg daily, Coreg 3.125 mg,Farxiga 10 mg daily, as needed lasix.   CAD: No angina symptoms at this time. On appropriate medical therapy. Patient suffered a prolonged episode of chest pain, shortness of breath, diaphoresis in 01/2019 for which she did not seek immediate medical attention. EKG, echocardiogram, stress test, and cardiac MRI point to a completed anterior infarct with no viability, no ischemia in rest of the myocardium. Continue medical  therapy at this time. Continue rosuvastatin 20 mg. Given mild anemia, continue plavix, without Aspirin  Labs in 6  months F/u in 1 year  Nigel Mormon, MD [facility] Pager: [phone] Office: [phone]

## 2020-05-09 ENCOUNTER — Encounter: Payer: Self-pay | Admitting: Cardiology

## 2020-05-09 ENCOUNTER — Other Ambulatory Visit: Payer: Self-pay

## 2020-05-09 ENCOUNTER — Ambulatory Visit: Payer: BC Managed Care – PPO | Admitting: Cardiology

## 2020-05-09 VITALS — BP 120/56 | HR 71 | Temp 97.2°F | Resp 16 | Ht 64.0 in | Wt 203.0 lb

## 2020-05-09 DIAGNOSIS — E782 Mixed hyperlipidemia: Secondary | ICD-10-CM

## 2020-05-09 DIAGNOSIS — I2510 Atherosclerotic heart disease of native coronary artery without angina pectoris: Secondary | ICD-10-CM

## 2020-05-09 DIAGNOSIS — I5020 Unspecified systolic (congestive) heart failure: Secondary | ICD-10-CM

## 2020-05-09 DIAGNOSIS — I252 Old myocardial infarction: Secondary | ICD-10-CM

## 2020-05-09 DIAGNOSIS — E119 Type 2 diabetes mellitus without complications: Secondary | ICD-10-CM

## 2020-05-09 DIAGNOSIS — I255 Ischemic cardiomyopathy: Secondary | ICD-10-CM

## 2020-05-09 MED ORDER — CARVEDILOL 3.125 MG PO TABS: 3.1250 mg | ORAL_TABLET | Freq: Two times a day (BID) | ORAL | 2 refills | Status: DC

## 2020-05-09 MED ORDER — ROSUVASTATIN CALCIUM 20 MG PO TABS: 20.0000 mg | ORAL_TABLET | Freq: Every day | ORAL | 3 refills | Status: DC

## 2020-05-09 MED ORDER — IVABRADINE HCL 5 MG PO TABS: 5.0000 mg | ORAL_TABLET | Freq: Two times a day (BID) | ORAL | 3 refills | Status: DC

## 2020-05-09 MED ORDER — DAPAGLIFLOZIN PROPANEDIOL 10 MG PO TABS: 10.0000 mg | ORAL_TABLET | Freq: Every day | ORAL | 3 refills | Status: DC

## 2020-05-09 MED ORDER — ENTRESTO 97-103 MG PO TABS: 1.0000 | ORAL_TABLET | Freq: Two times a day (BID) | ORAL | 3 refills | Status: DC

## 2020-05-09 MED ORDER — CLOPIDOGREL BISULFATE 75 MG PO TABS: 75.0000 mg | ORAL_TABLET | Freq: Every day | ORAL | 3 refills | Status: DC

## 2020-06-11 ENCOUNTER — Other Ambulatory Visit: Payer: Self-pay | Admitting: Cardiology

## 2020-07-12 ENCOUNTER — Other Ambulatory Visit: Payer: Self-pay

## 2020-07-12 DIAGNOSIS — I5020 Unspecified systolic (congestive) heart failure: Secondary | ICD-10-CM

## 2020-07-12 DIAGNOSIS — I255 Ischemic cardiomyopathy: Secondary | ICD-10-CM

## 2020-07-12 MED ORDER — IVABRADINE HCL 5 MG PO TABS: 5.0000 mg | ORAL_TABLET | Freq: Two times a day (BID) | ORAL | 3 refills | Status: DC

## 2020-07-24 ENCOUNTER — Other Ambulatory Visit: Payer: Self-pay | Admitting: Cardiology

## 2021-03-19 ENCOUNTER — Other Ambulatory Visit: Payer: Self-pay | Admitting: Cardiology

## 2021-03-19 DIAGNOSIS — I5020 Unspecified systolic (congestive) heart failure: Secondary | ICD-10-CM

## 2021-04-23 ENCOUNTER — Other Ambulatory Visit: Payer: Self-pay | Admitting: Cardiology

## 2021-04-23 DIAGNOSIS — I2510 Atherosclerotic heart disease of native coronary artery without angina pectoris: Secondary | ICD-10-CM

## 2021-05-09 ENCOUNTER — Ambulatory Visit: Payer: BC Managed Care – PPO | Admitting: Cardiology

## 2021-05-30 ENCOUNTER — Other Ambulatory Visit: Payer: Self-pay

## 2021-05-30 ENCOUNTER — Ambulatory Visit: Payer: Medicare Other | Admitting: Cardiology

## 2021-05-30 ENCOUNTER — Encounter: Payer: Self-pay | Admitting: Cardiology

## 2021-05-30 VITALS — BP 136/52 | HR 58 | Temp 98.0°F | Resp 16 | Ht 64.0 in | Wt 205.0 lb

## 2021-05-30 DIAGNOSIS — E119 Type 2 diabetes mellitus without complications: Secondary | ICD-10-CM

## 2021-05-30 DIAGNOSIS — E782 Mixed hyperlipidemia: Secondary | ICD-10-CM

## 2021-05-30 DIAGNOSIS — I5020 Unspecified systolic (congestive) heart failure: Secondary | ICD-10-CM

## 2021-05-30 DIAGNOSIS — I2510 Atherosclerotic heart disease of native coronary artery without angina pectoris: Secondary | ICD-10-CM

## 2021-05-30 MED ORDER — ASPIRIN EC 81 MG PO TBEC: 81.0000 mg | DELAYED_RELEASE_TABLET | Freq: Every day | ORAL | 3 refills | Status: DC

## 2021-05-30 NOTE — Progress Notes (Signed)
? ?Follow up visit ? ?Subjective:  ? ?Anna Barber, female    DOB: 20-Dec-1955, 66 y.o.   MRN: [id number] ? ?66 year old Caucasian female with hypertension, type II DM, strong family history of early CAD, HFrEF with recovered EF following late presentation of completed anterior MI in 01/2019  ? ?Patient suffered a prolonged episode of chest pain, shortness of breath, diaphoresis in 01/2019 for which she did not seek immediate medical attention. EKG, echocardiogram, stress test, and cardiac MRI point to a completed anterior infarct with no viability, no ischemia in rest of the myocardium, EF 35%.  ? ?Patient is doing well. She denies chest pain, shortness of breath, palpitations, leg edema, orthopnea, PND, TIA/syncope.  She admits to not being physically active with regular exercise.  Patient is an upcoming appointment with her PCP for routine labs soon. ? ? ?Current Outpatient Medications:  ?  aspirin EC 81 MG tablet, Take 1 tablet (81 mg total) by mouth daily. Swallow whole., Disp: 90 tablet, Rfl: 3 ?  carvedilol (COREG) 3.125 MG tablet, TAKE ONE TABLET TWICE DAILY, Disp: 180 tablet, Rfl: 2 ?  dapagliflozin propanediol (FARXIGA) 10 MG TABS tablet, Take 1 tablet (10 mg total) by mouth daily., Disp: 90 tablet, Rfl: 3 ?  desonide (DESOWEN) 0.05 % cream, APPLY TWICE DAILY FOR SEBORRHEIC DERMATITIS ON FACE FOR 1 TO 2 WEEKS, Disp: , Rfl:  ?  ivabradine (CORLANOR) 5 MG TABS tablet, Take 1 tablet (5 mg total) by mouth 2 (two) times daily with a meal., Disp: 180 tablet, Rfl: 3 ?  metFORMIN (GLUCOPHAGE) 500 MG tablet, Take by mouth as directed. 2 tablets in the morning, 2 tablets in the evening, Disp: , Rfl:  ?  nitroGLYCERIN (NITROSTAT) 0.4 MG SL tablet, Place 1 tablet (0.4 mg total) under the tongue every 5 (five) minutes as needed for chest pain., Disp: 30 tablet, Rfl: 2 ?  pantoprazole (PROTONIX) 40 MG tablet, TAKE ONE TABLET EACH DAY, Disp: 30 tablet, Rfl: 11 ?  rosuvastatin (CRESTOR) 20 MG tablet, TAKE ONE TABLET BY  MOUTH ONCE DAILY, Disp: 90 tablet, Rfl: 3 ?  sacubitril-valsartan (ENTRESTO) 97-103 MG, Take 1 tablet by mouth 2 (two) times daily., Disp: 180 tablet, Rfl: 3 ? ?Cardiovascular & other pertient studies: ? ?EKG 05/30/2021: ?Sinus rhythm 64 bpm ?Occasional PVC ?Low voltage in precordial leads ?Anteroseptal infarct -age undetermined ? ?Echocardiogram 05/24/2019:  ?Left ventricle cavity is normal in size. Normal left ventricular wall  ?thickness. Akinetic mid to distal anteroseptal, anteroapical myocardium.  ?LVEF 40-45%. Mildly depressed LV systolic function with EF 45%. Normal  ?global wall motion. Indeterminate diastolic filling pattern.  ?Left atrial cavity is mildly dilated.  ?Trileaflet aortic valve.  Mild (Grade I) aortic regurgitation.  ?Moderate (Grade II) mitral regurgitation.  ?Mild tricuspid regurgitation.  ?No evidence of pulmonary hypertension.  ?Compared to previous study on 03/09/2019, EF has improved from 25%. ? ?Cardiac MRI 03/31/2019: ?1. Mildly dilated LV with EF 35%. Wall motion abnormalities as noted ?above. ?2.  Normal RV size and systolic function. ?3. LGE pattern suggests prior LAD-territory MI. The apex, the ?peri-apical segments, and the mid anterior wall and mid anteroseptal ?wall are unlikely to improve with revascularization (likely ?non-viable). ?  ?Lexiscan Sestamibi Stress Test 03/09/2019: ?Nondiagnostic ECG stress. Resting EKG/ECG demonstrated normal sinus rhythm. Observed anterolateral infarct. Peak EKG/ECG no change with Lexiscan infusion. Patient developed chest pain level 2 of 10, and exhibited chest discomfort.  ?Extensive fixed perfusion abnormality with no reversibility noted in the mid to distal anterior, anteroseptal,  apical and mid to distal inferoseptal and inferoapical region suggestive of a completed infarct in the LAD territory. ?Gated images reveal akinesis in the same region.  LVEF severely dysfunctional at 26%.  High risk study, no prior study for comparison. ? ?Recent  labs: ?12/06/2020: ?Glucose 129, BUN/Cr 12/0.87. EGFR 74. Na/K 144/4.1. Rest of the CMP normal ?HbA1C 6.6% ?Chol 123, TG 99, HDL 57, LDL 48 ? ?11/09/2019: ?Glucose 120, BUN/Cr 9/0.8. EGFR 75. ?HbA1C 5.8% ?Chol 136, TG 70, HDL 56, LDL 66 ?TSH 2.1 normal ? ?03/07/2019: ?Chol 90, TG 131, HDL 30, LDL 37 ?Lipoprotein (a) 52, normal ? ?02/28/2019:  ?Glucose 130, Cr 0.67. EGFR 89. K 4.2.  ?H/H 10.5/32.9. MCV normal. Platelets 540 ?  ? ?Review of Systems  ?Cardiovascular:  Negative for chest pain, dyspnea on exertion, leg swelling, palpitations and syncope.  ? ?   ? ?Vitals:  ? 05/30/21 1312  ?BP: (!) 136/52  ?Pulse: (!) 58  ?Resp: 16  ?Temp: 98 ?F (36.7 ?C)  ?SpO2: 96%  ? ? ? ? ?Body mass index is 35.19 kg/m?. Danley Danker Weights  ? 05/30/21 1312  ?Weight: 205 lb (93 kg)  ? ? ? ?Objective:  ? Physical Exam ?Vitals and nursing note reviewed.  ?Constitutional:   ?   Appearance: She is well-developed.  ?Neck:  ?   Vascular: No JVD.  ?Cardiovascular:  ?   Rate and Rhythm: Normal rate and regular rhythm.  ?   Pulses: Intact distal pulses.  ?   Heart sounds: Normal heart sounds. No murmur heard. ?Pulmonary:  ?   Effort: Pulmonary effort is normal.  ?   Breath sounds: Normal breath sounds. No wheezing or rales.  ? ? ? ?  ICD-10-CM   ?1. Coronary artery disease involving native coronary artery of native heart without angina pectoris  I25.10   ?  ?2. HFrEF (heart failure with reduced ejection fraction) (HCC)  I50.20 EKG 12-Lead  ?  ?3. Type 2 diabetes mellitus without complication, without long-term current use of insulin (HCC)  E11.9   ?  ?4. Mixed hyperlipidemia  E78.2   ?  ? ? ? ?   ?Assessment & Recommendations:  ? ?66 year old Caucasian female with hypertension, type II DM, strong family history of early CAD, HFrEF with recovered EF following late presentation of completed anterior MI in 01/2019  ? ?HFrEF: ?Ischemic cardiomyopathy with completed anterior infarct-suffered while at home, did not seek immediate medical attention.  (01/2019) ?No viability in LAD territory on cardiac MRI. ?LVEF improved to 40-45% on echo (05/2019) ?Currently on Entresto to 97-103 mg BID. Continue Corlanor 5 mg daily, Coreg 3.125 mg,Farxiga 10 mg daily, as needed lasix.  ? ?CAD: ?No angina symptoms at this time. On appropriate medical therapy. ?Patient suffered a prolonged episode of chest pain, shortness of breath, diaphoresis in 01/2019 for which she did not seek immediate medical attention. EKG, echocardiogram, stress test, and cardiac MRI point to a completed anterior infarct with no viability, no ischemia in rest of the myocardium. ?Continue medical therapy at this time. Continue rosuvastatin 20 mg. Lipids very well controlled.  ?Stop Plavix, start aspirin 81 mg daily. ?I will consider doing a surveillance stress testing in 1-2 years. ? ?F/u in 1 year ? ?Nigel Mormon, MD ?[facility] ?Pager: [phone] ?Office: [phone] ?  ? ?

## 2021-05-31 ENCOUNTER — Other Ambulatory Visit: Payer: Self-pay

## 2021-05-31 DIAGNOSIS — I5020 Unspecified systolic (congestive) heart failure: Secondary | ICD-10-CM

## 2021-05-31 MED ORDER — ENTRESTO 97-103 MG PO TABS: 1.0000 | ORAL_TABLET | Freq: Two times a day (BID) | ORAL | 3 refills | Status: DC

## 2021-06-18 ENCOUNTER — Other Ambulatory Visit: Payer: Self-pay | Admitting: Cardiology

## 2021-06-18 DIAGNOSIS — I5020 Unspecified systolic (congestive) heart failure: Secondary | ICD-10-CM

## 2021-07-17 ENCOUNTER — Other Ambulatory Visit: Payer: Self-pay

## 2021-07-17 ENCOUNTER — Other Ambulatory Visit: Payer: Self-pay | Admitting: Cardiology

## 2021-07-17 DIAGNOSIS — I255 Ischemic cardiomyopathy: Secondary | ICD-10-CM

## 2021-07-17 DIAGNOSIS — I5020 Unspecified systolic (congestive) heart failure: Secondary | ICD-10-CM

## 2021-07-17 MED ORDER — PANTOPRAZOLE SODIUM 40 MG PO TBEC: DELAYED_RELEASE_TABLET | ORAL | 3 refills | Status: DC

## 2021-09-26 IMAGING — CR DG CHEST 2V
2 series · 2 of 2 positions shown · non-contrast
Comparison: 05/09/2009

CLINICAL DATA: Shortness of breath for 1 month.

EXAM:
CHEST - 2 VIEW

[w chest pa]
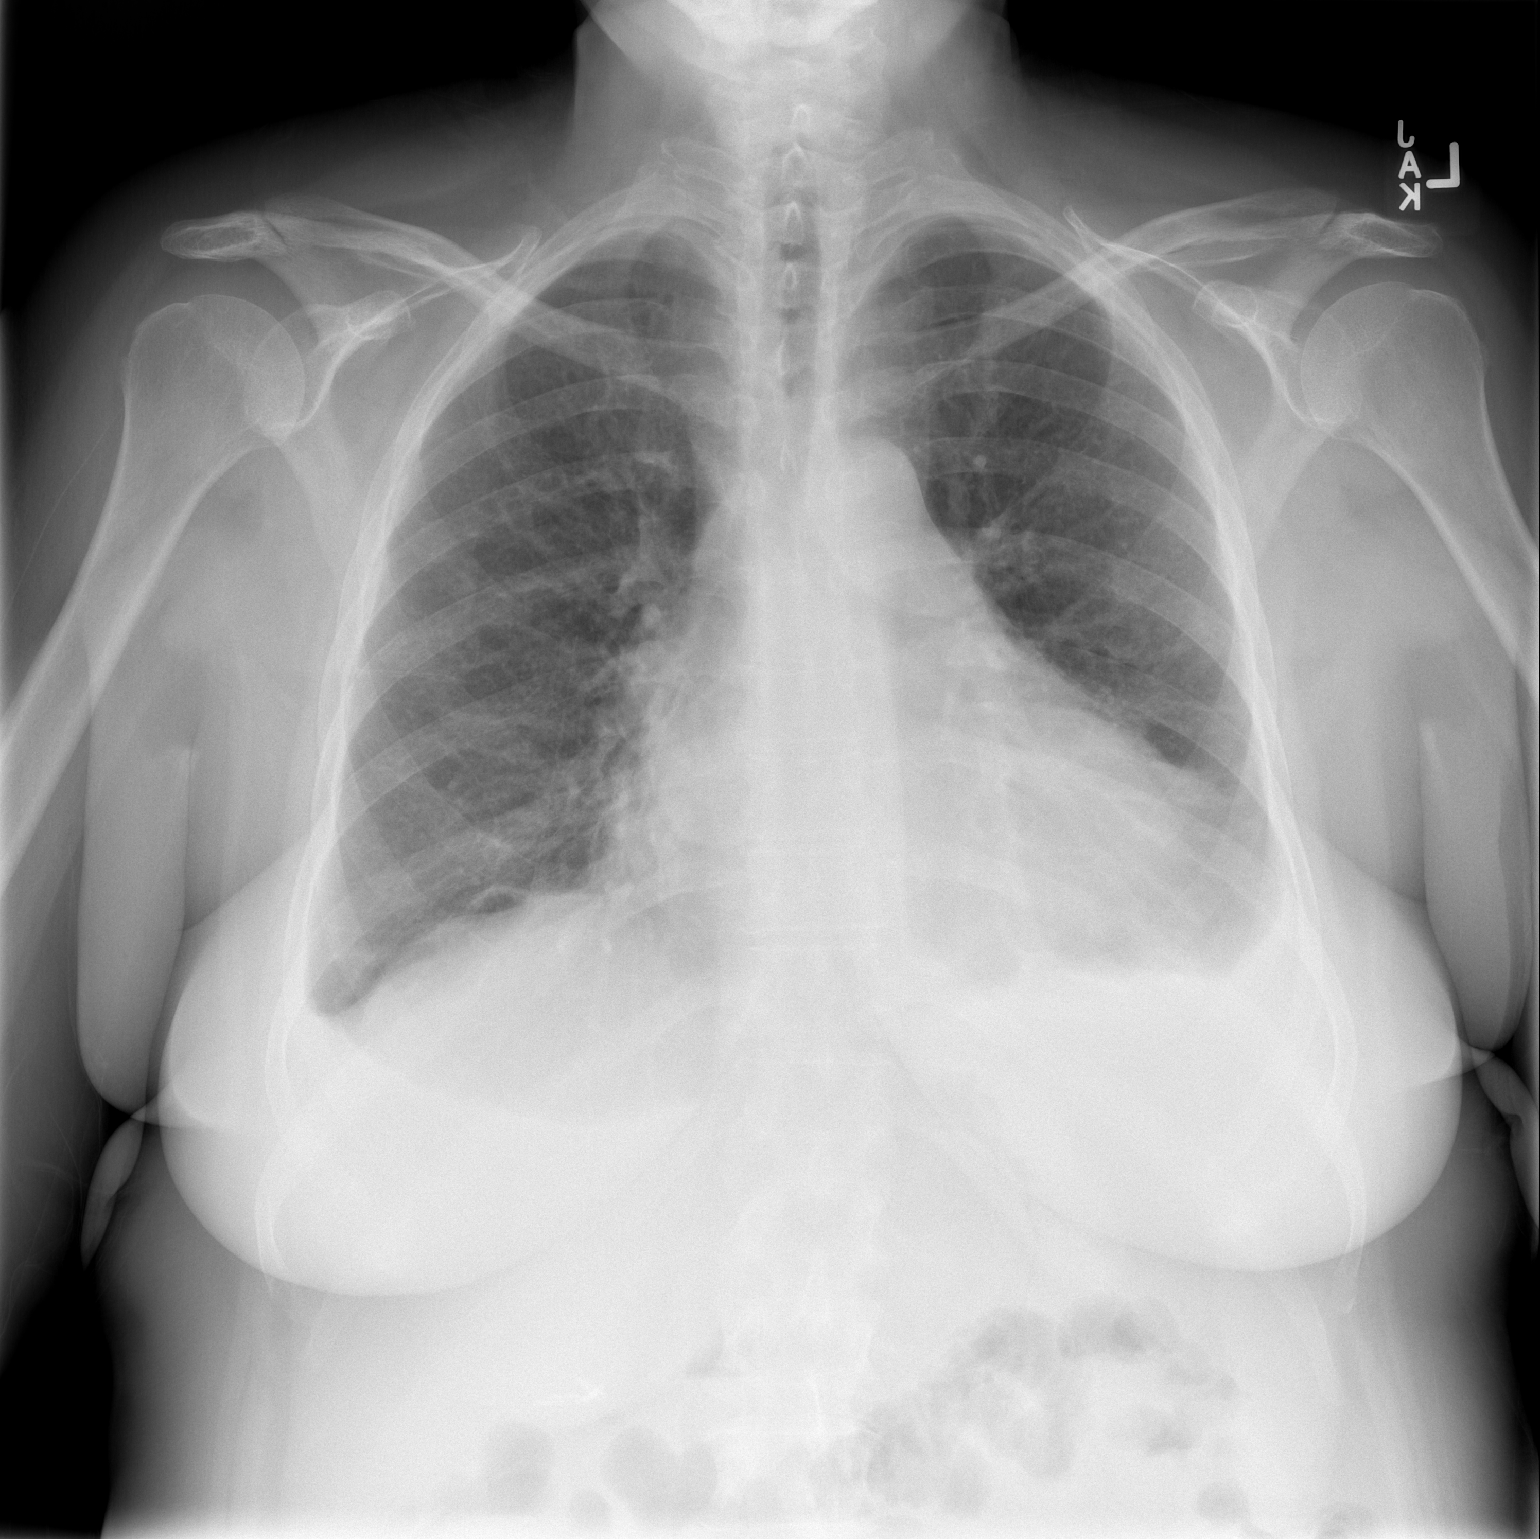

[w chest lat]
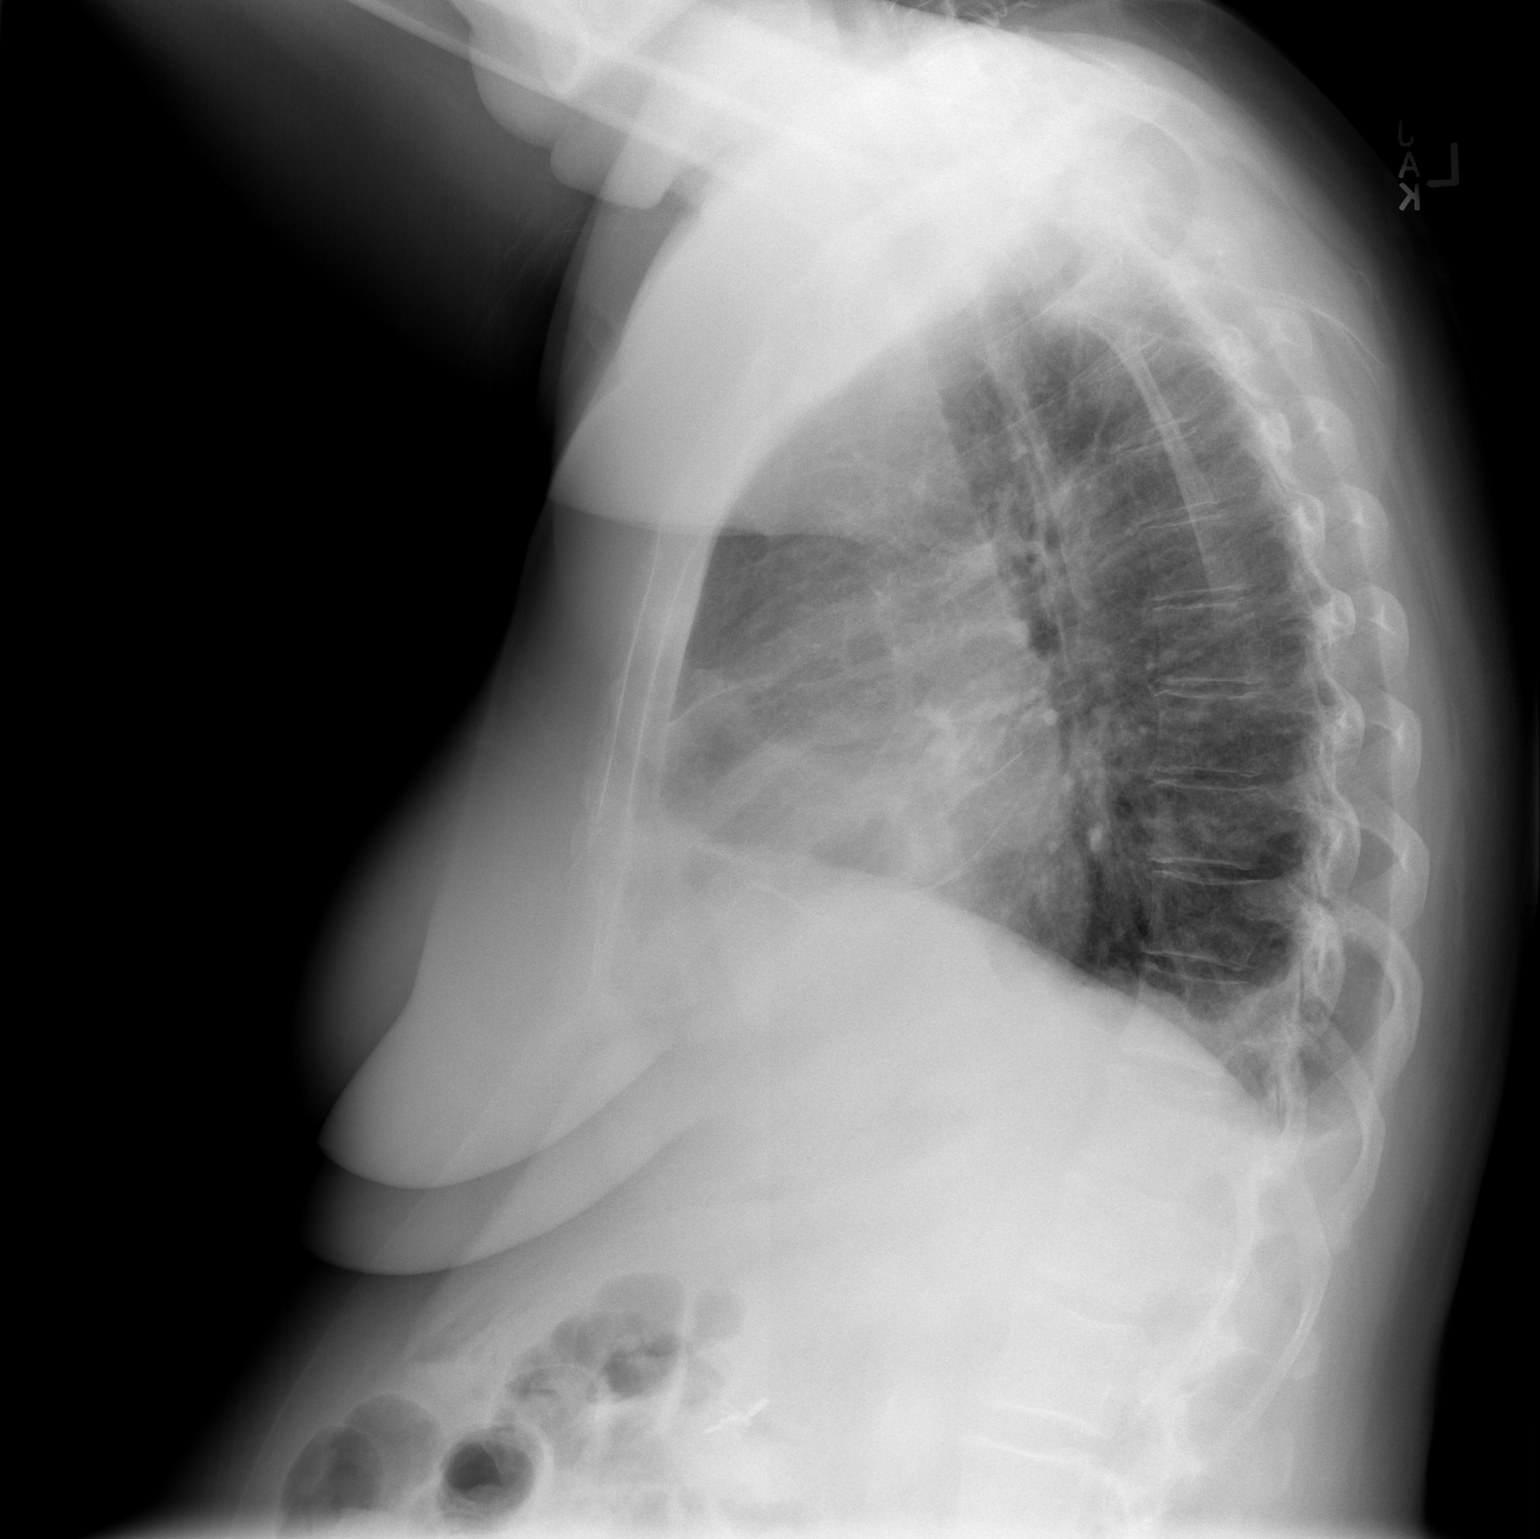

[2 of 2 positions shown; findings below may reference images not displayed]

FINDINGS: Mild cardiomegaly. Small bilateral pleural effusions. No evidence of
frank pulmonary edema or pulmonary consolidation.
IMPRESSION: Small bilateral pleural effusions.

Mild cardiomegaly.

## 2021-12-18 ENCOUNTER — Other Ambulatory Visit: Payer: Self-pay | Admitting: Cardiology

## 2021-12-18 DIAGNOSIS — I5020 Unspecified systolic (congestive) heart failure: Secondary | ICD-10-CM

## 2022-05-19 ENCOUNTER — Other Ambulatory Visit: Payer: Self-pay | Admitting: Cardiology

## 2022-05-19 DIAGNOSIS — I5020 Unspecified systolic (congestive) heart failure: Secondary | ICD-10-CM

## 2022-05-19 DIAGNOSIS — I2510 Atherosclerotic heart disease of native coronary artery without angina pectoris: Secondary | ICD-10-CM

## 2022-05-30 ENCOUNTER — Ambulatory Visit: Payer: Medicare Other | Admitting: Cardiology

## 2022-05-30 ENCOUNTER — Encounter: Payer: Self-pay | Admitting: Cardiology

## 2022-05-30 VITALS — BP 130/60 | HR 55 | Ht 64.0 in | Wt 222.0 lb

## 2022-05-30 DIAGNOSIS — I255 Ischemic cardiomyopathy: Secondary | ICD-10-CM

## 2022-05-30 DIAGNOSIS — I2510 Atherosclerotic heart disease of native coronary artery without angina pectoris: Secondary | ICD-10-CM

## 2022-05-30 DIAGNOSIS — I5020 Unspecified systolic (congestive) heart failure: Secondary | ICD-10-CM

## 2022-05-30 MED ORDER — ENTRESTO 97-103 MG PO TABS: 1.0000 | ORAL_TABLET | Freq: Two times a day (BID) | ORAL | 3 refills | Status: DC

## 2022-05-30 MED ORDER — IVABRADINE HCL 5 MG PO TABS: ORAL_TABLET | ORAL | 3 refills | Status: DC

## 2022-05-30 MED ORDER — ROSUVASTATIN CALCIUM 20 MG PO TABS: 20.0000 mg | ORAL_TABLET | Freq: Every day | ORAL | 3 refills | Status: DC

## 2022-05-30 MED ORDER — DAPAGLIFLOZIN PROPANEDIOL 10 MG PO TABS: 10.0000 mg | ORAL_TABLET | Freq: Every day | ORAL | 3 refills | Status: DC

## 2022-05-30 MED ORDER — CARVEDILOL 3.125 MG PO TABS: 3.1250 mg | ORAL_TABLET | Freq: Two times a day (BID) | ORAL | 3 refills | Status: DC

## 2022-05-30 MED ORDER — ASPIRIN 81 MG PO TBEC: 81.0000 mg | DELAYED_RELEASE_TABLET | Freq: Every day | ORAL | 3 refills | Status: DC

## 2022-05-30 NOTE — Progress Notes (Signed)
Follow up visit  Subjective:   Anna Barber, female    DOB: 14-Jun-1955, 67 y.o.   MRN: [id number]  67 year old Caucasian female with hypertension, type II DM, strong family history of early CAD, HFrEF with recovered EF following late presentation of completed anterior MI in 01/2019   Patient suffered a prolonged episode of chest pain, shortness of breath, diaphoresis in 01/2019 for which she did not seek immediate medical attention. EKG, echocardiogram, stress test, and cardiac MRI point to a completed anterior infarct with no viability, no ischemia in rest of the myocardium, EF 35%.   Patient is doing well. She denies chest pain, shortness of breath, palpitations, leg edema, orthopnea, PND, TIA/syncope.  She admits to not being physically active with regular exercise.  Patient is an upcoming appointment with her PCP for routine labs soon.   Current Outpatient Medications:    aspirin EC 81 MG tablet, Take 1 tablet (81 mg total) by mouth daily. Swallow whole., Disp: 90 tablet, Rfl: 3   carvedilol (COREG) 3.125 MG tablet, TAKE ONE TABLET TWICE DAILY, Disp: 180 tablet, Rfl: 2   CORLANOR 5 MG TABS tablet, TAKE ONE TABLET TWICE DAILY WITH A MEAL, Disp: 180 tablet, Rfl: 3   desonide (DESOWEN) 0.05 % cream, APPLY TWICE DAILY FOR SEBORRHEIC DERMATITIS ON FACE FOR 1 TO 2 WEEKS, Disp: , Rfl:    ENTRESTO 97-103 MG, TAKE ONE TABLET BY MOUTH TWICE DAILY, Disp: 180 tablet, Rfl: 3   FARXIGA 10 MG TABS tablet, TAKE ONE TABLET BY MOUTH ONCE DAILY, Disp: 90 tablet, Rfl: 3   metFORMIN (GLUCOPHAGE) 500 MG tablet, Take by mouth as directed. 2 tablets in the morning, 2 tablets in the evening, Disp: , Rfl:    nitroGLYCERIN (NITROSTAT) 0.4 MG SL tablet, Place 1 tablet (0.4 mg total) under the tongue every 5 (five) minutes as needed for chest pain., Disp: 30 tablet, Rfl: 2   pantoprazole (PROTONIX) 40 MG tablet, TAKE ONE TABLET EACH DAY, Disp: 90 tablet, Rfl: 3   rosuvastatin (CRESTOR) 20 MG tablet, TAKE ONE  TABLET BY MOUTH ONCE DAILY, Disp: 90 tablet, Rfl: 3  Cardiovascular & other pertient studies:  EKG 05/30/2022: Sinus rhythm 52 bpm Old anterior infarct  Echocardiogram 05/24/2019:  Left ventricle cavity is normal in size. Normal left ventricular wall  thickness. Akinetic mid to distal anteroseptal, anteroapical myocardium.  LVEF 40-45%. Mildly depressed LV systolic function with EF 45%. Normal  global wall motion. Indeterminate diastolic filling pattern.  Left atrial cavity is mildly dilated.  Trileaflet aortic valve.  Mild (Grade I) aortic regurgitation.  Moderate (Grade II) mitral regurgitation.  Mild tricuspid regurgitation.  No evidence of pulmonary hypertension.  Compared to previous study on 03/09/2019, EF has improved from 25%.  Cardiac MRI 03/31/2019: 1. Mildly dilated LV with EF 35%. Wall motion abnormalities as noted above. 2.  Normal RV size and systolic function. 3. LGE pattern suggests prior LAD-territory MI. The apex, the peri-apical segments, and the mid anterior wall and mid anteroseptal wall are unlikely to improve with revascularization (likely non-viable).   Lexiscan Sestamibi Stress Test 03/09/2019: Nondiagnostic ECG stress. Resting EKG/ECG demonstrated normal sinus rhythm. Observed anterolateral infarct. Peak EKG/ECG no change with Lexiscan infusion. Patient developed chest pain level 2 of 10, and exhibited chest discomfort.  Extensive fixed perfusion abnormality with no reversibility noted in the mid to distal anterior, anteroseptal, apical and mid to distal inferoseptal and inferoapical region suggestive of a completed infarct in the LAD territory. Gated images reveal akinesis in  the same region.  LVEF severely dysfunctional at 26%.  High risk study, no prior study for comparison.  Recent labs: 05/26/2021: Glucose 138, BUN/Cr 22/1.06. EGFR 58. Na/K 139/4.8. Rest of the CMP normal Hb 14.4 HbA1C 6.4% Chol 145, TG 118, HDL 56, LDL 68   Review of Systems   Cardiovascular:  Negative for chest pain, dyspnea on exertion, leg swelling, palpitations and syncope.        Vitals:   05/30/22 1408  BP: 130/60  Pulse: (!) 55  SpO2: 97%     Body mass index is 38.11 kg/m. Filed Weights   05/30/22 1408  Weight: 222 lb (100.7 kg)     Objective:   Physical Exam Vitals and nursing note reviewed.  Constitutional:      Appearance: She is well-developed.  Neck:     Vascular: No JVD.  Cardiovascular:     Rate and Rhythm: Normal rate and regular rhythm.     Pulses: Intact distal pulses.     Heart sounds: Normal heart sounds. No murmur heard. Pulmonary:     Effort: Pulmonary effort is normal.     Breath sounds: Normal breath sounds. No wheezing or rales.        ICD-10-CM   1. Coronary artery disease involving native coronary artery of native heart without angina pectoris  I25.10 EKG 12-Lead    PCV MYOCARDIAL PERFUSION WO LEXISCAN    2. HFrEF (heart failure with reduced ejection fraction) (HCC)  I50.20           Assessment & Recommendations:   67 year old Caucasian female with hypertension, type II DM, strong family history of early CAD, HFrEF with recovered EF following late presentation of completed anterior MI in 01/2019   HFrEF: Ischemic cardiomyopathy with completed anterior infarct-suffered while at home, did not seek immediate medical attention. (01/2019) No viability in LAD territory on cardiac MRI. LVEF improved to 40-45% on echo (05/2019) Currently on Entresto to 97-103 mg BID. Continue Corlanor 5 mg daily, Coreg 3.125 mg,Farxiga 10 mg daily, as needed lasix.   CAD: No angina symptoms at this time. On appropriate medical therapy. Patient suffered a prolonged episode of chest pain, shortness of breath, diaphoresis in 01/2019 for which she did not seek immediate medical attention. EKG, echocardiogram, stress test, and cardiac MRI point to a completed anterior infarct with no viability, no ischemia in rest of the  myocardium. Continue medical therapy at this time. Continue rosuvastatin 20 mg. Lipids very well controlled.  Stop Plavix, start aspirin 81 mg daily. I will check exercise nuclear stress test for risk stratification.  F/u in 1 year  Nigel Mormon, MD [facility] Pager: [phone] Office: [phone]

## 2022-07-01 ENCOUNTER — Other Ambulatory Visit: Payer: Self-pay

## 2022-07-01 ENCOUNTER — Other Ambulatory Visit: Payer: Self-pay | Admitting: Cardiology

## 2022-07-01 MED ORDER — ASPIRIN 81 MG PO TBEC: DELAYED_RELEASE_TABLET | ORAL | 3 refills | Status: DC

## 2022-07-03 ENCOUNTER — Ambulatory Visit: Payer: Medicare Other

## 2022-07-03 DIAGNOSIS — I2510 Atherosclerotic heart disease of native coronary artery without angina pectoris: Secondary | ICD-10-CM

## 2022-07-18 ENCOUNTER — Other Ambulatory Visit: Payer: Self-pay | Admitting: Cardiology

## 2022-12-08 ENCOUNTER — Other Ambulatory Visit: Payer: Self-pay | Admitting: Internal Medicine

## 2022-12-08 DIAGNOSIS — Z122 Encounter for screening for malignant neoplasm of respiratory organs: Secondary | ICD-10-CM

## 2022-12-09 ENCOUNTER — Other Ambulatory Visit: Payer: Self-pay | Admitting: Internal Medicine

## 2022-12-09 DIAGNOSIS — R053 Chronic cough: Secondary | ICD-10-CM

## 2022-12-09 DIAGNOSIS — Z801 Family history of malignant neoplasm of trachea, bronchus and lung: Secondary | ICD-10-CM

## 2022-12-29 ENCOUNTER — Ambulatory Visit
Admission: RE | Admit: 2022-12-29 | Discharge: 2022-12-29 | Disposition: A | Discharge status: Home / Self Care | Payer: Medicare Other | Source: Ambulatory Visit | Attending: Internal Medicine

## 2022-12-29 DIAGNOSIS — Z801 Family history of malignant neoplasm of trachea, bronchus and lung: Secondary | ICD-10-CM

## 2022-12-29 DIAGNOSIS — R053 Chronic cough: Secondary | ICD-10-CM

## 2023-01-20 ENCOUNTER — Telehealth: Payer: Self-pay | Admitting: Cardiology

## 2023-01-20 NOTE — Telephone Encounter (Signed)
I spoke with patient and told her I would send her message to our pharmacist but that she should not start fluconazole until she heard back from our office

## 2023-01-20 NOTE — Telephone Encounter (Signed)
Pt c/o medication issue:  1. Name of Medication:   ivabradine (CORLANOR) 5 MG TABS tablet    2. How are you currently taking this medication (dosage and times per day)? As prescribed   3. Are you having a reaction (difficulty breathing--STAT)? No   4. What is your medication issue? Patient was prescribed Fluconazole 150 mg for a yeast infection. Pharmacist advised upon pickup medication interacts with corlanor. She is requesting a callback to be advised if she needs to stop this medication to take the 1 time dose of 150 MG. Please advise.

## 2023-01-20 NOTE — Telephone Encounter (Signed)
Anna Barber, [place]  to Me     01/20/23  5:03 PM  She can hold ivabradine for 7 day and take fluconazole 150 mg singe dose for yeast infection.  Patient notified.

## 2023-05-28 ENCOUNTER — Ambulatory Visit: Payer: Medicare Other | Admitting: Cardiology

## 2023-05-29 ENCOUNTER — Ambulatory Visit: Payer: Self-pay | Admitting: Cardiology

## 2023-06-08 ENCOUNTER — Other Ambulatory Visit: Payer: Self-pay | Admitting: Cardiology

## 2023-06-08 DIAGNOSIS — I255 Ischemic cardiomyopathy: Secondary | ICD-10-CM

## 2023-06-08 DIAGNOSIS — I2510 Atherosclerotic heart disease of native coronary artery without angina pectoris: Secondary | ICD-10-CM

## 2023-06-08 DIAGNOSIS — I5020 Unspecified systolic (congestive) heart failure: Secondary | ICD-10-CM

## 2023-06-25 ENCOUNTER — Ambulatory Visit: Admitting: Cardiology

## 2023-07-01 ENCOUNTER — Other Ambulatory Visit: Payer: Self-pay | Admitting: Cardiology

## 2023-07-01 DIAGNOSIS — I5020 Unspecified systolic (congestive) heart failure: Secondary | ICD-10-CM

## 2023-07-07 ENCOUNTER — Other Ambulatory Visit: Payer: Self-pay | Admitting: Cardiology

## 2023-07-07 DIAGNOSIS — I5020 Unspecified systolic (congestive) heart failure: Secondary | ICD-10-CM

## 2023-07-07 DIAGNOSIS — I255 Ischemic cardiomyopathy: Secondary | ICD-10-CM

## 2023-07-21 ENCOUNTER — Ambulatory Visit: Admitting: Cardiology

## 2023-07-23 ENCOUNTER — Other Ambulatory Visit: Payer: Self-pay | Admitting: Cardiology

## 2023-08-05 ENCOUNTER — Other Ambulatory Visit: Payer: Self-pay | Admitting: Cardiology

## 2023-08-05 DIAGNOSIS — I255 Ischemic cardiomyopathy: Secondary | ICD-10-CM

## 2023-08-05 DIAGNOSIS — I5020 Unspecified systolic (congestive) heart failure: Secondary | ICD-10-CM

## 2023-08-18 ENCOUNTER — Other Ambulatory Visit: Payer: Self-pay | Admitting: Cardiology

## 2023-08-18 DIAGNOSIS — I255 Ischemic cardiomyopathy: Secondary | ICD-10-CM

## 2023-08-18 DIAGNOSIS — I5020 Unspecified systolic (congestive) heart failure: Secondary | ICD-10-CM

## 2023-08-19 ENCOUNTER — Other Ambulatory Visit: Payer: Self-pay | Admitting: Cardiology

## 2023-08-19 DIAGNOSIS — I5020 Unspecified systolic (congestive) heart failure: Secondary | ICD-10-CM

## 2023-08-19 DIAGNOSIS — I255 Ischemic cardiomyopathy: Secondary | ICD-10-CM

## 2023-08-19 MED ORDER — IVABRADINE HCL 5 MG PO TABS: 5.0000 mg | ORAL_TABLET | Freq: Two times a day (BID) | ORAL | 2 refills | Status: DC

## 2023-08-25 MED ORDER — CARVEDILOL 3.125 MG PO TABS: 3.1250 mg | ORAL_TABLET | Freq: Two times a day (BID) | ORAL | 0 refills | Status: DC

## 2023-08-25 NOTE — Addendum Note (Signed)
 Addended by: Maze Corniel A on: 08/25/2023 11:10 AM   Modules accepted: Orders

## 2023-09-12 ENCOUNTER — Other Ambulatory Visit: Payer: Self-pay | Admitting: Cardiology

## 2023-09-12 DIAGNOSIS — I2510 Atherosclerotic heart disease of native coronary artery without angina pectoris: Secondary | ICD-10-CM

## 2023-09-14 ENCOUNTER — Encounter: Payer: Self-pay | Admitting: Cardiology

## 2023-09-14 ENCOUNTER — Ambulatory Visit: Attending: Cardiology | Admitting: Cardiology

## 2023-09-14 VITALS — BP 110/50 | HR 63 | Ht 63.0 in | Wt 194.2 lb

## 2023-09-14 DIAGNOSIS — I5020 Unspecified systolic (congestive) heart failure: Secondary | ICD-10-CM | POA: Diagnosis present

## 2023-09-14 DIAGNOSIS — I2510 Atherosclerotic heart disease of native coronary artery without angina pectoris: Secondary | ICD-10-CM | POA: Insufficient documentation

## 2023-09-14 DIAGNOSIS — I255 Ischemic cardiomyopathy: Secondary | ICD-10-CM | POA: Diagnosis present

## 2023-09-14 MED ORDER — IVABRADINE HCL 5 MG PO TABS: 5.0000 mg | ORAL_TABLET | Freq: Two times a day (BID) | ORAL | 3 refills | Status: AC

## 2023-09-14 MED ORDER — ASPIRIN 81 MG PO TBEC: DELAYED_RELEASE_TABLET | ORAL | 3 refills | Status: AC

## 2023-09-14 MED ORDER — CARVEDILOL 3.125 MG PO TABS: 3.1250 mg | ORAL_TABLET | Freq: Two times a day (BID) | ORAL | 3 refills | Status: AC

## 2023-09-14 MED ORDER — ROSUVASTATIN CALCIUM 40 MG PO TABS: 40.0000 mg | ORAL_TABLET | Freq: Every day | ORAL | 3 refills | Status: AC

## 2023-09-14 MED ORDER — ENTRESTO 97-103 MG PO TABS: 1.0000 | ORAL_TABLET | Freq: Two times a day (BID) | ORAL | 3 refills | Status: AC

## 2023-09-14 MED ORDER — DAPAGLIFLOZIN PROPANEDIOL 10 MG PO TABS: 10.0000 mg | ORAL_TABLET | Freq: Every day | ORAL | 3 refills | Status: AC

## 2023-09-14 NOTE — Progress Notes (Signed)
 Cardiology Office Note:  .   Date:  09/14/2023  ID:  Anna Barber, DOB Sep 17, 1955, MRN [id number] PCP: Anna Dire, MD (Inactive)  [facility] Providers Cardiologist:  Anna Lawrence, MD PCP: Anna Dire, MD (Inactive)  Chief Complaint  Patient presents with   Coronary Artery Disease     Anna Barber is a 68 y.o. female with hypertension, type II DM, strong family history of early CAD, HFrEF with recovered EF following late presentation of completed anterior MI in 01/2019   History of Present Illness  Patient suffered a prolonged episode of chest pain, shortness of breath, diaphoresis in 01/2019 for which she did not seek immediate medical attention. EKG, echocardiogram, stress test, and cardiac MRI point to a completed anterior infarct with no viability, no ischemia in rest of the myocardium, EF 35%.   She is doing well, denies any complaints of chest pain shortness of breath.  She is lost 30 pounds since starting Ozempic and is hoping to lose more and do more physical activity.  Reviewed recent lab results with the patient, details below.    Vitals:   09/14/23 1608  BP: (!) 110/50  Pulse: 63  SpO2: 95%      Review of Systems  Cardiovascular:  Negative for chest pain, dyspnea on exertion, leg swelling, palpitations and syncope.        Studies Reviewed: Anna Barber        EKG 09/14/2023: Normal sinus rhythm Low voltage QRS Septal infarct , age undetermined No previous ECGs available    Labs 05/2023: Chol 134, TG 83, HDL 55, LDL 63 HbA1C 6.0% Hb 13.6 Cr 0.8   Regadenoson  (with Mod Bruce protocol) Nuclear stress test 06/2022: Severe degree large extent perfusion defect consistent with  a large scar in the entire antero-septal and infero-septal and apical segment with mild peri-infarct ischemia. Gated SPECT imaging of the left ventricle demonstrating akinesis of the entire anteroseptal wall and inferoseptal wall.  LV is mildly dilated in both  rest and stress. TID ratio 0.78, which is normal. Stress LV EF is hyperdynamic 73% but visually appears to be at least mildly depressed with hyperdynamic wall motion in normally perfused walls.    Nondiagnostic ECG stress. The heart rate response was consistent with Regadenoson .  No previous exam available for comparison. High risk study.    Echocardiogram 05/2019:  Left ventricle cavity is normal in size. Normal left ventricular wall  thickness. Akinetic mid to distal anteroseptal, anteroapical myocardium.  LVEF 40-45%. Mildly depressed LV systolic function with EF 45%. Normal  global wall motion. Indeterminate diastolic filling pattern.  Left atrial cavity is mildly dilated.  Trileaflet aortic valve.  Mild (Grade I) aortic regurgitation.  Moderate (Grade II) mitral regurgitation.  Mild tricuspid regurgitation.  No evidence of pulmonary hypertension.  Compared to previous study on 03/09/2019, EF has improved from 25%.   Physical Exam Vitals and nursing note reviewed.  Constitutional:      General: She is not in acute distress. Neck:     Vascular: No JVD.   Cardiovascular:     Rate and Rhythm: Normal rate and regular rhythm.     Heart sounds: Normal heart sounds. No murmur heard. Pulmonary:     Effort: Pulmonary effort is normal.     Breath sounds: Normal breath sounds. No wheezing or rales.   Musculoskeletal:     Right lower leg: No edema.     Left lower leg: No edema.      VISIT DIAGNOSES:  ICD-10-CM   1. Coronary artery disease involving native coronary artery of native heart without angina pectoris  I25.10 EKG 12-Lead    2. HFrEF (heart failure with reduced ejection fraction) (HCC)  I50.20 EKG 12-Lead    3. Ischemic cardiomyopathy  I25.5 EKG 12-Lead       Anna Barber is a 68 y.o. female with hypertension, type II DM, strong family history of early CAD, HFrEF with recovered EF following late presentation of completed anterior MI in 01/2019  Assessment &  Plan  HFrEF: Ischemic cardiomyopathy with completed anterior infarct-suffered while at home, did not seek immediate medical attention. (01/2019) No viability in LAD territory on cardiac MRI. LVEF improved to 40-45% on echo (05/2019) Currently on Entresto  to 97-103 mg BID. Continue Corlanor 5 mg daily, Coreg  3.125 mg,Farxiga  10 mg daily, as needed lasix .  Check echocardiogram.  If remains <50%, could consider adding spironolactone . Refilled above meds today.   CAD: No angina symptoms at this time. On appropriate medical therapy. Patient suffered a prolonged episode of chest pain, shortness of breath, diaphoresis in 01/2019 for which she did not seek immediate medical attention. EKG, echocardiogram, stress test, and cardiac MRI point to a completed anterior infarct with no viability, no ischemia in rest of the myocardium. Continue medical therapy at this time. Continue aspirin  81 mg daily. LDL 63 on Crestor  20 mg daily.  With her prior MI, and diabetes, goal LDL <55.  Increase Crestor  to 40 mg daily.  Lipid panel can be checked with PCP during regular follow-up visits.      Meds ordered this encounter  Medications   aspirin  EC (GNP ASPIRIN ) 81 MG tablet    Sig: TAKE ONE TABLET BY MOUTH EVERY DAY. SWALLOW whole    Dispense:  90 tablet    Refill:  3   carvedilol  (COREG ) 3.125 MG tablet    Sig: Take 1 tablet (3.125 mg total) by mouth 2 (two) times daily.    Dispense:  180 tablet    Refill:  3   dapagliflozin  propanediol (FARXIGA ) 10 MG TABS tablet    Sig: Take 1 tablet (10 mg total) by mouth daily.    Dispense:  90 tablet    Refill:  3   ivabradine  (CORLANOR) 5 MG TABS tablet    Sig: Take 1 tablet (5 mg total) by mouth 2 (two) times daily with a meal.    Dispense:  180 tablet    Refill:  3   sacubitril-valsartan (ENTRESTO ) 97-103 MG    Sig: Take 1 tablet by mouth 2 (two) times daily.    Dispense:  180 tablet    Refill:  3   rosuvastatin  (CRESTOR ) 40 MG tablet    Sig: Take 1 tablet  (40 mg total) by mouth daily.    Dispense:  90 tablet    Refill:  3     F/u in 1 year  Signed, Anna JINNY Lawrence, MD

## 2023-09-14 NOTE — Patient Instructions (Signed)
 Medication Instructions:  Your physician has recommended you make the following change in your medication:  1-INCREASE Rosuvastatin  40 mg by mouth daily  *If you need a refill on your cardiac medications before your next appointment, please call your pharmacy*  Lab Work: If you have labs (blood work) drawn today and your tests are completely normal, you will receive your results only by: MyChart Message (if you have MyChart) OR A paper copy in the mail If you have any lab test that is abnormal or we need to change your treatment, we will call you to review the results.  Testing/Procedures: Your physician has requested that you have an echocardiogram. Echocardiography is a painless test that uses sound waves to create images of your heart. It provides your doctor with information about the size and shape of your heart and how well your heart's chambers and valves are working. This procedure takes approximately one hour. There are no restrictions for this procedure. Please do NOT wear cologne, perfume, aftershave, or lotions (deodorant is allowed). Please arrive 15 minutes prior to your appointment time.  Please note: We ask at that you not bring children with you during ultrasound (echo/ vascular) testing. Due to room size and safety concerns, children are not allowed in the ultrasound rooms during exams. Our front office staff cannot provide observation of children in our lobby area while testing is being conducted. An adult accompanying a patient to their appointment will only be allowed in the ultrasound room at the discretion of the ultrasound technician under special circumstances. We apologize for any inconvenience. Follow-Up: At [facility], you and your health needs are our priority.  As part of our continuing mission to provide you with exceptional heart care, our providers are all part of one team.  This team includes your primary Cardiologist (physician) and Advanced Practice  Providers or APPs (Physician Assistants and Nurse Practitioners) who all work together to provide you with the care you need, when you need it.  Your next appointment:   1 year(s)  Provider:   Newman JINNY Lawrence, MD    We recommend signing up for the patient portal called MyChart.  Sign up information is provided on this After Visit Summary.  MyChart is used to connect with patients for Virtual Visits (Telemedicine).  Patients are able to view lab/test results, encounter notes, upcoming appointments, etc.  Non-urgent messages can be sent to your provider as well.   To learn more about what you can do with MyChart, go to [email or web address].   Other Instructions Diet & Lifestyle recommendations:  Physical activity recommendation (The Physical Activity Guidelines for Americans. JAMA 2018;Nov 12) At least 150-300 minutes a week of moderate-intensity, or 75-150 minutes a week of vigorous-intensity aerobic physical activity, or an equivalent combination of moderate- and vigorous-intensity aerobic activity. Adults should perform muscle-strengthening activities on 2 or more days a week. Older adults should do multicomponent physical activity that includes balance training as well as aerobic and muscle-strengthening activities. Benefits of increased physical activity include lower risk of mortality including cardiovascular mortality, lower risk of cardiovascular events and associated risk factors (hypertension and diabetes), and lower risk of many cancers (including bladder, breast, colon, endometrium, esophagus, kidney, lung, and stomach). Additional improvments have been seen in cognition, risk of dementia, anxiety and depression, improved bone health, lower risk of falls, and associated injuries.  Dietary recommendation The 2019 ACC/AHA guidelines promote nutrition as a main fixture of cardiovascular wellness, with a recommendation for a varied diet of fruit,  vegetables, fish, legumes, and whole  grains (Class I), as well as recommendations to reduce sodium, cholesterol, processed meats, and refined sugars (Class IIa recommendation).10 Sodium intake, a topic of some controversy as of late, is recommended to be kept at 1,500 mg/day or less, far below the average daily intake in the [place]  of 3,409 mg/day, and notably below that of previous [place]  recommendations for 300mg /day.10,11 For those unable to reach 1,500 mg/day, they recommend at least a reduction of 1000 mg/day.  A Pesco-Mediterranean Diet With Intermittent Fasting: JACC Review Topic of the Week. J Am Coll Cardiol 2020;76:1484-1493 Pesco-Mediterranean diet, it is supplemented with extra-virgin olive oil (EVOO), which is the principle fat source, along with moderate amounts of dairy (particularly yogurt and cheese) and eggs, as well as modest amounts of alcohol consumption (ideally red wine with the evening meal), but few red and processed meats.

## 2023-09-16 ENCOUNTER — Encounter: Payer: Self-pay | Admitting: *Deleted

## 2023-09-16 DIAGNOSIS — N3281 Overactive bladder: Secondary | ICD-10-CM | POA: Insufficient documentation

## 2023-09-16 DIAGNOSIS — I700 Atherosclerosis of aorta: Secondary | ICD-10-CM | POA: Insufficient documentation

## 2023-09-16 DIAGNOSIS — K819 Cholecystitis, unspecified: Secondary | ICD-10-CM | POA: Insufficient documentation

## 2023-09-16 DIAGNOSIS — K219 Gastro-esophageal reflux disease without esophagitis: Secondary | ICD-10-CM | POA: Insufficient documentation

## 2023-09-16 DIAGNOSIS — E1121 Type 2 diabetes mellitus with diabetic nephropathy: Secondary | ICD-10-CM | POA: Insufficient documentation

## 2023-09-16 DIAGNOSIS — Z860100 Personal history of colon polyps, unspecified: Secondary | ICD-10-CM | POA: Insufficient documentation

## 2023-09-16 DIAGNOSIS — K5730 Diverticulosis of large intestine without perforation or abscess without bleeding: Secondary | ICD-10-CM | POA: Insufficient documentation

## 2023-09-16 DIAGNOSIS — N182 Chronic kidney disease, stage 2 (mild): Secondary | ICD-10-CM | POA: Insufficient documentation

## 2023-09-24 ENCOUNTER — Ambulatory Visit: Admitting: Cardiology

## 2023-10-28 ENCOUNTER — Ambulatory Visit (HOSPITAL_COMMUNITY)
Admission: RE | Admit: 2023-10-28 | Discharge: 2023-10-28 | Disposition: A | Discharge status: Home / Self Care | Source: Ambulatory Visit | Attending: Cardiovascular Disease | Admitting: Cardiovascular Disease

## 2023-10-28 DIAGNOSIS — I255 Ischemic cardiomyopathy: Secondary | ICD-10-CM | POA: Diagnosis not present

## 2023-10-28 DIAGNOSIS — I2510 Atherosclerotic heart disease of native coronary artery without angina pectoris: Secondary | ICD-10-CM | POA: Diagnosis present

## 2023-10-28 DIAGNOSIS — I5020 Unspecified systolic (congestive) heart failure: Secondary | ICD-10-CM | POA: Insufficient documentation

## 2023-10-28 LAB — ECHOCARDIOGRAM COMPLETE
Area-P 1/2: 3.6 cm2
S' Lateral: 2.3 cm

## 2023-10-28 MED ORDER — PERFLUTREN LIPID MICROSPHERE
1.0000 mL | INTRAVENOUS | Status: AC | PRN
  Administered 2023-10-28: 2 mL via INTRAVENOUS

## 2023-11-01 ENCOUNTER — Ambulatory Visit: Payer: Self-pay | Admitting: Cardiology

## 2023-12-11 ENCOUNTER — Other Ambulatory Visit: Payer: Self-pay | Admitting: Cardiology

## 2023-12-11 DIAGNOSIS — I255 Ischemic cardiomyopathy: Secondary | ICD-10-CM

## 2023-12-11 DIAGNOSIS — I5020 Unspecified systolic (congestive) heart failure: Secondary | ICD-10-CM

## 2024-01-06 ENCOUNTER — Other Ambulatory Visit (HOSPITAL_COMMUNITY): Payer: Self-pay

## 2024-01-06 ENCOUNTER — Telehealth: Payer: Self-pay | Admitting: Pharmacy Technician

## 2024-01-06 NOTE — Telephone Encounter (Signed)
 Pharmacy Patient Advocate Encounter   Received notification from CoverMyMeds that prior authorization for Entresto  97-103 MG is required/requested.   Insurance verification completed.   The patient is insured through [facility].   Per test claim: Refill too soon. PA is not needed at this time. Medication was filled 01/05/24. Next eligible fill date is 01/27/24   filled for generic.
